# Patient Record
Sex: Male | Born: 1955 | Race: White | Hispanic: No | Marital: Married | Smoking: Never smoker
Health system: Southern US, Community
[De-identification: ages and names within clinical notes are randomized; demographics above are authoritative.]

## PROBLEM LIST (undated history)

## (undated) DIAGNOSIS — K5790 Diverticulosis of intestine, part unspecified, without perforation or abscess without bleeding: Secondary | ICD-10-CM

## (undated) DIAGNOSIS — I252 Old myocardial infarction: Secondary | ICD-10-CM

## (undated) DIAGNOSIS — E119 Type 2 diabetes mellitus without complications: Secondary | ICD-10-CM

## (undated) DIAGNOSIS — N200 Calculus of kidney: Secondary | ICD-10-CM

## (undated) DIAGNOSIS — Z860100 Personal history of colon polyps, unspecified: Secondary | ICD-10-CM

## (undated) DIAGNOSIS — I2510 Atherosclerotic heart disease of native coronary artery without angina pectoris: Secondary | ICD-10-CM

## (undated) DIAGNOSIS — I10 Essential (primary) hypertension: Secondary | ICD-10-CM

## (undated) DIAGNOSIS — E785 Hyperlipidemia, unspecified: Secondary | ICD-10-CM

## (undated) HISTORY — DX: Type 2 diabetes mellitus without complications: E11.9

## (undated) HISTORY — PX: CARDIAC CATHETERIZATION: SHX172

## (undated) HISTORY — DX: Essential (primary) hypertension: I10

## (undated) HISTORY — DX: Atherosclerotic heart disease of native coronary artery without angina pectoris: I25.10

## (undated) HISTORY — PX: OTHER SURGICAL HISTORY: SHX169

## (undated) HISTORY — DX: Personal history of colonic polyps: Z86.010

## (undated) HISTORY — DX: Hyperlipidemia, unspecified: E78.5

## (undated) HISTORY — DX: Old myocardial infarction: I25.2

## (undated) HISTORY — PX: CORONARY ANGIOPLASTY: SHX604

## (undated) HISTORY — DX: Personal history of colon polyps, unspecified: Z86.0100

## (undated) HISTORY — DX: Calculus of kidney: N20.0

## (undated) HISTORY — DX: Diverticulosis of intestine, part unspecified, without perforation or abscess without bleeding: K57.90

---

## 1999-08-24 ENCOUNTER — Encounter: Admission: RE | Admit: 1999-08-24 | Discharge: 1999-11-22 | Payer: Self-pay | Admitting: Internal Medicine

## 2001-04-02 ENCOUNTER — Encounter: Payer: Self-pay | Admitting: Internal Medicine

## 2001-04-02 ENCOUNTER — Observation Stay (HOSPITAL_COMMUNITY): Admission: EM | Admit: 2001-04-02 | Discharge: 2001-04-03 | Payer: Self-pay | Admitting: *Deleted

## 2001-04-03 ENCOUNTER — Inpatient Hospital Stay (HOSPITAL_COMMUNITY): Admission: AD | Admit: 2001-04-03 | Discharge: 2001-04-05 | Payer: Self-pay | Admitting: Internal Medicine

## 2001-04-19 ENCOUNTER — Encounter (HOSPITAL_COMMUNITY): Admission: RE | Admit: 2001-04-19 | Discharge: 2001-05-19 | Payer: Self-pay | Admitting: *Deleted

## 2001-05-23 ENCOUNTER — Encounter (HOSPITAL_COMMUNITY): Admission: RE | Admit: 2001-05-23 | Discharge: 2001-06-22 | Payer: Self-pay | Admitting: *Deleted

## 2001-06-25 ENCOUNTER — Encounter (HOSPITAL_COMMUNITY): Admission: RE | Admit: 2001-06-25 | Discharge: 2001-07-25 | Payer: Self-pay | Admitting: *Deleted

## 2004-09-03 ENCOUNTER — Ambulatory Visit: Payer: Self-pay | Admitting: *Deleted

## 2005-05-27 ENCOUNTER — Ambulatory Visit: Payer: Self-pay | Admitting: *Deleted

## 2006-05-02 ENCOUNTER — Ambulatory Visit: Payer: Self-pay | Admitting: *Deleted

## 2007-08-29 ENCOUNTER — Ambulatory Visit: Payer: Self-pay | Admitting: Cardiovascular Disease

## 2007-10-02 ENCOUNTER — Encounter (HOSPITAL_COMMUNITY): Admission: RE | Admit: 2007-10-02 | Discharge: 2007-11-01 | Payer: Self-pay | Admitting: Cardiovascular Disease

## 2007-10-02 ENCOUNTER — Ambulatory Visit: Payer: Self-pay | Admitting: Cardiology

## 2009-02-11 DIAGNOSIS — E785 Hyperlipidemia, unspecified: Secondary | ICD-10-CM | POA: Insufficient documentation

## 2009-02-11 DIAGNOSIS — I10 Essential (primary) hypertension: Secondary | ICD-10-CM | POA: Insufficient documentation

## 2009-02-11 DIAGNOSIS — I2510 Atherosclerotic heart disease of native coronary artery without angina pectoris: Secondary | ICD-10-CM | POA: Insufficient documentation

## 2009-02-11 DIAGNOSIS — E119 Type 2 diabetes mellitus without complications: Secondary | ICD-10-CM

## 2009-02-13 ENCOUNTER — Ambulatory Visit: Payer: Self-pay | Admitting: Cardiovascular Disease

## 2010-07-28 ENCOUNTER — Ambulatory Visit (HOSPITAL_COMMUNITY): Admission: RE | Admit: 2010-07-28 | Discharge: 2010-07-28 | Payer: Self-pay | Admitting: Internal Medicine

## 2011-02-01 NOTE — Assessment & Plan Note (Signed)
[facility]ALTHCARE                       [place] CARDIOLOGY OFFICE NOTE   PHELAN, SCHADT                      MRN:          [id number]  DATE:02/13/2009                            DOB:          [birth year]    Ms. Dockendorf seen today in followup for his coronary disease.  He has had  a previous angioplasty of an OM branch back in 2002 by Dr. Gerri Spore.  His last stress Myoview study was done in January 2009.  He had normal  myocardial perfusion.  Of note, he tends to have abnormal EKG responses  to exercise.  These would appear to be false positives.  Overall, he has  been compliant with his meds.  He has hypertension, hyperlipidemia, and  type 2 diabetes.  He sees Dr. Ouida Sills for his primary care needs.  Unfortunately, his weight continues to be high and we had a long  discussion regarding this and talked particularly about a low carb [place]-type diet.  He is currently 228 pounds.  He has had some erectile  dysfunction which I also would attribute to his diabetes with autonomic  dysfunction and small vessel disease.  He is not a good candidate for by  her spouse given his coronary artery disease.   I think the patient can easily lose 20-30 pounds if he cuts up the carbs  particularly pasta, bread, and starch, and potatoes.   His hemoglobin A1c is elevated at 7.0 which also indicates poor diet.  I  reviewed lab work from Dr. Alonza Smoker office.  His hematocrit was good at  46.5, creatinine is 0.91.  His blood sugar was elevated at 171.  LFTs  were normal.  His LDL was 75 and his hemoglobin A1c was elevated at 7.0.   Otherwise, he is doing well.  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   His hours are somewhat long.  He gets up early in the morning and can  get home late.  He tries to work out at [employer] twice a week, but  probably he does not do enough aerobic activity.  He has not had chest  pain, PND, or orthopnea.  There has  been no lower extremity edema.  There has been no diaphoresis.  The 10-point review of systems otherwise  negative outside of erectile dysfunction.   He is happily married.  He has no kids.  His wife's health seems to be  good.  He does not drink or smoke.   MEDICATIONS:  1. He is on Toprol 25 a day.  2. Altace 2-1/2 a day.  3. Multivitamins.  4. Niacin 100 g nightly.  5. Junuvia 100 mics daily.  6. Simvastatin 40 a day.  7. Metformin 2 g daily.  8. Glimepiride 4 mg a day.   PHYSICAL EXAMINATION:  GENERAL:  Exam is remarkable for a jovial  overweight male in no distress.  Affect is appropriate.  VITAL SIGNS:  Weight is 228, blood pressure 110/70, pulse 85 and  regular, respiratory rate 14, afebrile.  HEENT:  Unremarkable.  Carotids are normal without bruit.  No  lymphadenopathy, thyromegaly, or JVP elevation.  LUNGS:  Clear.  Good diaphragmatic motion.  No wheezing.  S1 and S2  normal heart sounds.  PMI normal.  ABDOMEN:  Benign.  Bowel sounds positive.  No AAA.  No tenderness.  No  bruit.  No hepatosplenomegaly or hepatojugular reflux.  No tenderness.  EXTREMITIES:  Distal pulses are intact with no edema.  NEUROLOGIC:  Nonfocal.  SKIN:  Warm and dry.  MUSCULOSKELETAL:  No muscular weakness.   EKG shows sinus rhythm with T-wave changes in the inferior leads, which  are slightly more prominent than his last EKG.   IMPRESSION:  1. Coronary disease, previous obtuse marginal intervention      asymptomatic, low-risk Myoview year ago.  Continue aspirin and beta-      blocker.  2. Hypercholesterolemia with end range, currently not having side      effects, continue current medications.  3. Diabetes, poor control.  Adopt a [employer].  Follow up      Dr. Ouida Sills for hemoglobin A1c in 3 months, target weight loss about      20 pounds in the next 3 months.  4. Hypertension currently well-controlled.  Continue current dose of      Altace.  No evidence of gross  proteinuria.  5. Erectile dysfunction again should improve with weight loss and      better diabetes control, not a good candidate for Cialis or Viagra.     Noralyn Pick. Eden Emms, MD, [facility]  Electronically Signed    PCN/MedQ  DD: 02/13/2009  DT: 02/14/2009  Job #: [id number]   cc:   Kingsley Callander. Ouida Sills, MD

## 2011-02-01 NOTE — Assessment & Plan Note (Signed)
[facility] HEALTHCARE                       [place] CARDIOLOGY OFFICE NOTE   Jonathan Walsh, Jonathan Walsh                      MRN:          [id number]  DATE:08/29/2007                            DOB:          November 11, 1955    Jonathan Walsh is seen as a new patient to me.  He is a previous patient of  Dr. Dorethea Clan.  He has known coronary disease with angioplasty to the OM in  '02.  He has not had a follow up stress-test since '03.  He unfortunately has  diabetes which is poorly controlled.  His hemoglobin A1C's have been in  the high 7's.  He is supposed to check his sugar at home by maybe does  it once a week.  He has gained about 15 pounds in the last 6 months.  Had a long discussion with Fayrene Fearing regarding this. He understands the  increased risk of insulin resistance as his weight goes up.  He also  understands that he needs a stress-test given his diabetes and previous  coronary disease with no functional study in the last 5 years.   Fortunately he does not smoke.   Risk factors are otherwise fairly well modified outside of his diabetes.  Prior to his angioplasty he did get some pressure and diaphoresis, he  has not had any of these.  There has been no syncope, palpitations, PND,  orthopnea.  He has trace edema at the end of the day.  His review of  systems is otherwise negative.   CURRENT MEDICATIONS:  Toprol 25 daily, Altace 2.5 daily, multivitamins,  niacin 1 gram q.h.s., an aspirin daily, Januvia 1 gram daily,  Simvastatin 40 daily, Metformin 2 grams daily.   PHYSICAL EXAMINATION:  GENERAL:  Exam is remarkable for an overweight  middle-aged white male in no distress. Affect is appropriate.  VITAL SIGNS:  Blood pressure is 112/70, pulse 70 and regular,  respiratory rate 12.  NECK:  Supple. Carotids are normal with no bruit, no lymphadenopathy,  thyromegaly or JVP elevation.  LUNGS:  Clear with good diaphragmatic motion. No wheezing.  CARDIOVASCULAR:  Heart  sounds are distant, there is an S1 and S2, no  obvious murmur, PMI is not palpable.  ABDOMEN:  Benign, bowel sounds positive, no triple A, no tenderness, no  hepatosplenomegaly or hepatojugular reflux.  EXTREMITIES:  Distal pulses are intact. Trace edema.  NEURO:  Nonfocal, no muscular weakness.   IMPRESSION:  1. Previous coronary disease with angioplasty to the OM in 2002.      Followup Myoview.  2. Diabetes poorly controlled. Followup with Dr. Leonel Ramsay. Hemoglobin      A1C in 3 months. Increase home monitoring.  3. Hypertension currently well controlled. Continue ACE inhibitor      particularly in light of its renal protective effects for his      diabetes.  4. Hyperlipidemia.  Followup liver profile in 6 months. Continue      Simvastatin 40 a day.   I will see the patient back in a year as long as his stress-test is  normal.     Theron Arista C. Eden Emms, MD, [facility]  Electronically Signed    PCN/MedQ  DD: 08/29/2007  DT: 08/29/2007  Job #: [id number]   cc:   Kingsley Callander. Ouida Sills, MD

## 2011-02-04 NOTE — Discharge Summary (Signed)
[place]  Patient:    Jonathan Walsh, Jonathan Walsh                        MRN: [id number] Adm. Date:  04/03/01 Disc. Date: 04/05/01 Dictator:   Tereso Newcomer, P.A.                           Discharge Summary  DATE OF BIRTH:  [birth year]  ADDENDUM:  The patient will remain on Plavix 75 mg a day for six to nine months, and consideration should be made for Foltx in the future. DD:  04/05/01 TD:  04/05/01 Job: [id number]

## 2011-02-04 NOTE — Assessment & Plan Note (Signed)
[facility] HEALTHCARE                         [place] CARDIOLOGY OFFICE NOTE   Jonathan Walsh, Jonathan Walsh                      MRN:          [id number]  DATE:05/02/2006                            DOB:          1955/10/30    Primary care physician is Dr. Ouida Sills.   [name] returns today for a routine followup.  He is a man with coronary  disease status post percutaneous revascularization of his obtuse marginal  back in 2002.  He has diabetes, hypertension and hyperlipidemia.  He has  done very well over the course of the last 9 months since I have seen him.  No real problems.  He is here just for secondary prophylaxis for his  coronary disease.   PHYSICAL EXAMINATION:  He is 225 pounds.  Blood pressure is 118/72.  His  pulse is 70.  His chest is clear.  He has no jugular venous distention or  carotid bruits.  Lower extremities are without significant edema.   Most recent labs from January show a basic metabolic panel which looks good  with the exception of slightly elevated glucose at 113.  His total  cholesterol is 158.  Triglycerides are 173.  HDL 53.  LDL 69.  His ALT looks  normal, and his hemoglobin A1c is 6.2.   MEDICATIONS:  Toprol XL 25 once a day, Altace 2.5 once a day, multivitamin  once a day, Folgard 25 mg once a day, Crestor 10 mg once a day, Niacin 1000  mg q.h.s., metformin 1500 units a day, aspirin 325 a day, and he takes  Januvia 100 mg once a day.   Overall, he is doing fine.  I do not think there are any particular problems  with his secondary prophylaxis.  Dr. Ouida Sills appears to be doing a reasonably  good job with that, so hopefully we will see him back in 12 months for  routine followup.                                   Farris Has. Dorethea Clan, MD   JMH/MedQ  DD:  05/02/2006  DT:  05/03/2006  Job #:  [id number]   cc:   Kingsley Callander. Ouida Sills, MD

## 2011-02-04 NOTE — Cardiovascular Report (Signed)
. [facility]  Patient:    Jonathan Walsh, Jonathan Walsh                        MRN: [id number] Proc. Date: 04/05/01 Adm. Date:  [id number] Attending:  Pricilla Riffle CC:         Carylon Perches, M.D.  Dietrich Pates, M.D. [facility]   Cardiac Catheterization  PROCEDURES PERFORMED: 1. Left heart catheterization with coronary angiography and left    ventriculography. 2. Percutaneous transluminal coronary angioplasty utilizing the Cutting    Balloon of the second obtuse marginal branch.  INDICATIONS:  The patient is a 55 year old male, who presented to [facility] with chest pain.  ECG showed lateral ST segment depression which resolved with therapy.  He had elevated troponins consistent with a small non-Q-wave myocardial infarction.  He was referred for cardiac catheterization.  DESCRIPTION OF PROCEDURE:  A 6 French sheath was placed in the right femoral artery.  Standard Judkins 6 French catheters were utilized.  Contrast was Omnipaque.  There were no complications.  RESULTS:  HEMODYNAMICS:  Left ventricular pressure 122/20, aortic pressure 122/82. There was no aortic valve gradient.  LEFT VENTRICULOGRAM:  The wall motion is normal.  Ejection fraction is estimated at greater than 60%.  There is no mitral regurgitation.  CORONARY ARTERIOGRAPHY:  (Right dominant).  Coronary arteries are diffusely ectatic.  Left main:  Left main has luminal irregularities.  Left anterior descending:  Left anterior descending artery has mild ectasia with 20% stenosis in the mid vessel followed by 25% stenosis in the mid vessel.  The distal vessel has 30% followed by 20% stenosis.  There is a normal sized first diagonal with a 40% stenosis, a small second diagonal with a 70% stenosis at its origin.  Left circumflex:  The left circumflex also has moderate ectasia.  There is a 30% stenosis in the mid vessel.  The circumflex gives rise to a small OM-1, a large branching OM-2 and a  small OM-3.  OM-2 has a 99% stenosis in its medial branch right at the bifurcation.  There is a 30% stenosis extending into the more lateral branch at OM-2.  Right coronary artery:  The right coronary artery has diffuse ectasia and diffuse irregularities throughout.  The proximal mid vessel or diffuse 25% stenoses.  In the distal vessel is a 30% following by a second 30% stenosis. There is a large posterior descending artery with a 30% stenosis in the mid vessel.  There is a small posterolateral branch.  IMPRESSIONS: 1. Normal left ventricular systolic function. 2. Coronary arteries notable for diffuse ectasia with one-vessel coronary    artery disease characterized by 99% stenosis in the branch of the second    obtuse marginal.  PLAN:  Percutaneous intervention of the obtuse marginal.  See below.  PERCUTANEOUS TRANSLUMINAL CORONARY ANGIOPLASTY PROCEDURE:  Following completion of diagnostic catheterization, we proceeded with coronary intervention.  The 6 French sheath in the right femoral artery was exchanged over a wire for a 7 [place] sheath.  We used a 7 [place] Voda left 3.5 guiding catheter.  Heparin and Integrilin were administered per protocol.  We advanced a traverse wire under fluoroscopic guidance into the medial branch of OM-2. We then advanced a second traverse wire under fluoroscopic guidance into the lateral branch of OM-2.  We then performed PTCA of the lesion in the medial branch of OM-2 utilizing a 2.0 x 15 mm Maverick balloon.  This balloon was inflated  to 8 atmospheres.  We then utilized a Cutting Balloon which was 2.25 x 10 mm.  This balloon was inflated sequentially to 6, 8 and 10 atmospheres respectively.  We then went back with our 2.0 x 15 mm Maverick balloon across this same lesion, inflated this to 14 atmospheres for 2 minutes and then a second inflation to 14 atmospheres for 3 minutes.  This resulted in significant improvement in the vessel lumen.  However,  the more lateral branch at its origin became somewhat hazy.  We therefore positioned the 2.0 x 15 mm Maverick balloon across the lateral branch and inflated this to 12 atmospheres.  We finally went back again in the medial branch with the 2.0 x 15 mm Maverick balloon, inflated it again to 14 atmospheres for 2 minutes.  Final angiographic images were obtained revealing patency of both branches of OM-2.  The 99% stenosis in the medial branch has been reduced to 20% residual with TIMI-3 flow.  Patency of the lateral branch was maintained with a stable 30% stenosis.  COMPLICATIONS:  None.  RESULTS:  Successful PTCA utilizing the Cutting Balloon of the medial branch of OM-2.  A 99% stenosis was reduced to 20% residual with TIMI-3 flow.  PLAN:  Integrilin will be continued for 18 hours.  It was recommended that the patient be treated with Plavix for 6-9 months due to his presentation with an acute coronary syndrome, his multiple cardiovascular risk factors and diffuse changes in his coronary arteries as described. DD:  04/04/01 TD:  04/05/01 Job: [id number]

## 2011-02-04 NOTE — Discharge Summary (Signed)
. [facility]  Patient:    Jonathan Walsh, Jonathan Walsh                        MRN: [id number] Adm. Date:  [id number] Disc. Date: 04/05/01 Attending:  Pricilla Riffle Dictator:   Tereso Newcomer, P.A. CC:         Dr. Carylon Perches   Discharge Summary  DISCHARGE DIAGNOSES: 1. Non-Q wave myocardial infarction. 2. Coronary artery disease. 3. Normal left ventricular ejection fraction. 4. Diabetes mellitus type 2. 5. Hyperlipidemia. 6. Family history of early coronary artery disease.  PROCEDURES: 1. Cardiac catheterization by Dr. Daisey Must on April 04, 2001, revealing    left main normal, left anterior descending mid 20/25, distal 30/20,    diagonal-1 with normal with 40% stenosis, diagonal-2 small with 70%    stenosis, left circumflex mid 30%, obtuse marginal large 99%, medial branch    30% at branch, right coronary artery diffuse 25% distal/30, posterior    descending artery large 30%, normal left ventricular ejection fraction    greater than 60%, normal mitral regurgitation, normal wall motion. 2. Status post percutaneous transluminal coronary angioplasty with cutting    balloon to obtuse marginal reducing stenosis from 99% to 20%.  HOSPITAL COURSE:  This 55 year old male was seen originally at [facility] in [place].  On the day prior to admission, just after pushing a stalled golf cart up a slight incline, he had a twinge of chest pain and after eating he had chest heaviness and diaphoresis that last for about five minutes.  This went away and then came back for about 40 minutes.  His EKG revealed new lateral ST depression and troponins of 0.08 and 0.048.  He was transferred to [facility] for cardiac catheterization.  He was continued on aspirin, Lopressor and Lovenox.  He denied any further chest pain.  His hemodynamics remained stable and on April 04, 2001, he underwent cardiac catheterization by Dr. Gerri Spore.  The results are noted above.   He tolerated the procedure and had no immediate complications.  On the morning of April 05, 2001, he was found to be in stable condition without any chest pain. His right groin was without hematomas or bruits.  He would be ready for discharge once his Integrilin infusion was finished.  LABORATORY DATA AND X-RAY FINDINGS:  White blood cell count 11,400, hemoglobin 15.8, hematocrit 44.4, platelet count 286,000.  INR 1.1.  Sodium 134, potassium 4.1, chloride 104, CO2 26, BUN 13, creatinine 0.9, glucose 100, total bilirubin 0.7, Alk phos 53, SGOT 35, SGPT 51, total protein 7.3, albumin 4.2, calcium 8.7.  Cardiac enzymes with total CK 147, CK-MB 2.1, troponin I 0.06.  Second CK 152, CK-MB 4.7, troponin I 0.48.  Third CK 152, CK-MB 2.3, troponin I 0.36.  Fourth CK 92, CK-MB 0.8.  DISCHARGE MEDICATIONS: 1. Lopressor 25 mg b.i.d. 2. Pravachol 40 mg q.d. 3. Avandia 4 mg q.d. 4. Plavix 75 mg q.d. 5. Coated aspirin 325 mg q.d. 6. Altace 2.5 mg q.d. x 1 week, then 5 mg q.d. x 3 weeks and then 10 mg a day. 7. Nitroglycerin 0.4 mg sublingual p.r.n. chest pain. 8. Glucophage XR to be restarted on Saturday, July 20.  ACTIVITY:  No driving for one week.  No heavy lifting, exertion or work until seen by doctor or P.A. again.  DIET:  Low fat, low sodium, diabetic diet.  SPECIAL INSTRUCTIONS:  The patient should call our office  for any groin swelling, bleeding or bruising.  The patients Altace will be titrated upward. He will need a BMP checked on followup on July 22.  His Altace is 2.5 mg a day x 1 week, then 5 mg a day x 3 weeks, then 10 mg a day if his blood pressure tolerates and his renal function stays normal.  The patient works a low stress job and wishes to return to work in one week.  This will be reassessed at follow-up appointment.  He has been advised of the dangers restarting his Glucophage too soon after cardiac catheterization.  FOLLOWUP:  He will follow up with P.A. for Dr. Daisey Must on Monday, July 22, at 11 a.m.  Follow up with Dr. Ouida Sills as needed. DD:  04/05/01 TD:  04/05/01 Job: [id number]

## 2011-03-08 ENCOUNTER — Other Ambulatory Visit (HOSPITAL_COMMUNITY): Payer: Self-pay | Admitting: Internal Medicine

## 2011-03-08 DIAGNOSIS — L539 Erythematous condition, unspecified: Secondary | ICD-10-CM

## 2011-03-09 ENCOUNTER — Ambulatory Visit (HOSPITAL_COMMUNITY)
Admission: RE | Admit: 2011-03-09 | Discharge: 2011-03-09 | Disposition: A | Discharge status: Home / Self Care | Payer: 59 | Source: Ambulatory Visit | Attending: Internal Medicine | Admitting: Internal Medicine

## 2011-03-09 DIAGNOSIS — M79609 Pain in unspecified limb: Secondary | ICD-10-CM | POA: Insufficient documentation

## 2011-03-09 DIAGNOSIS — M7989 Other specified soft tissue disorders: Secondary | ICD-10-CM | POA: Insufficient documentation

## 2011-03-09 DIAGNOSIS — L539 Erythematous condition, unspecified: Secondary | ICD-10-CM

## 2011-09-14 ENCOUNTER — Encounter: Payer: Self-pay | Admitting: Cardiology

## 2012-07-12 ENCOUNTER — Telehealth (HOSPITAL_COMMUNITY): Payer: Self-pay | Admitting: Dietician

## 2012-07-12 NOTE — Telephone Encounter (Signed)
Appointment scheduled for 07/13/12 at 1:00 PM.

## 2012-07-13 ENCOUNTER — Encounter (HOSPITAL_COMMUNITY): Payer: Self-pay | Admitting: Dietician

## 2012-07-13 NOTE — Progress Notes (Addendum)
Outpatient Initial Nutrition Assessment  Date:07/13/2012   Appt Start Time: 1058  Referring Physician: Dr. Ouida Sills Reason for Visit: diabetes  Nutrition Assessment:  Height: 5' 6.5" (168.9 cm)   Weight: 224 lb (101.606 kg)   IBW: 145# %IBW: 154% UBW: 210# %UBW: 107%  Body mass index is 35.61 kg/(m^2). Classified as obesity, class II.   Goal Weight: 202# (10% weight loss) Weight hx: Pt reports his weight is "higher than normal". His lowest weight was 190-202 as a younger adult. He weighed 210# about 1 year ago. He considers 210# his UBW.   Estimated nutritional needs: 2174-2370 kcals daily, 80-102 grams protein daily, 2.2-2.4 L fluid daily  PMH:  Past Medical History  Diagnosis Date  . Diabetes mellitus   . Hyperlipidemia   . Hypertension   . Coronary artery disease     Medications:  Current Outpatient Rx  Name Route Sig Dispense Refill  . ASPIRIN 325 MG PO TABS Oral Take 325 mg by mouth daily.      Marland Kitchen METFORMIN HCL 500 MG PO TABS Oral Take 500 mg by mouth.      . METOPROLOL SUCCINATE ER 25 MG PO TB24 Oral Take 25 mg by mouth.      Marland Kitchen NIACIN ER (ANTIHYPERLIPIDEMIC) 1000 MG PO TBCR Oral Take 1,000 mg by mouth.      Marland Kitchen NITROGLYCERIN 0.4 MG/HR TD PT24 Transdermal Place 1 patch onto the skin.      Marland Kitchen RAMIPRIL 2.5 MG PO TABS Oral Take 2.5 mg by mouth daily.      Marland Kitchen SIMVASTATIN 40 MG PO TABS Oral Take 40 mg by mouth.      Marland Kitchen SITAGLIPTIN PHOSPHATE 100 MG PO TABS Oral Take 100 mg by mouth.        Labs: CMP  No results found for this basename: na, k, cl, co2, glucose, bun, creatinine, calcium, prot, albumin, ast, alt, alkphos, bilitot, gfrnonaa, gfraa    Lipid Panel  No results found for this basename: chol, trig, hdl, cholhdl, vldl, ldlcalc     No results found for this basename: HGBA1C   No results found for this basename: GLUF, MICROALBUR, LDLCALC, CREATININE     Per Dr. Alonza Smoker records: Hgb A1c: 7.8. Pt reports previous Hgb A1c was 6.4.   Lifestyle/ social habits: Mr. Huckaba is  a very pleasant gentleman who lives in [place], [place]. He lives with his wife, who is present with them today. They have no children. He works full time as a Production designer, theatre/television/film at a [employer] and his job requires a good amount of travelling. He reports his stress level as 5/10, citing time management and travelling for work as his main stressor. He currently walk 3 days a week, about 2 miles at a time. He also has access to a gym. He reports that he was much more active in the past, but has difficult fitting exercise into his work schedule.   Nutrition hx/habits: Mr. Wiederholt reports that he has gone on diets in the past to successfully lose weight and manage glycemic control. He reports he has intermittently followed the [employer] per the suggestion of his cardiologist (Dr. Eden Emms), however, it would never "stick" for long periods of time. He is looking for a more "balanced approach" to his lifestyle. He reports that he eats well when he is at home, as he wife prepares most of the meal and controls his portions. He reports he struggles when he is travelling or going out to eat. He  also admits to excessive snacking. He finds it difficult to follow a consistent meal pattern at times. He reports he attended a diabetic class several years ago, however, there is no record of attendance of pt being seen in this department at [facility].   Diet recall: Breakfast (6 AM): 1/2 sandwich thin with Smuckers Natural Peanut Butter, diet Sun Drop; Mid-morning snack: 6 pack peanut butter crackers; Lunch: ham sandwich made with sandwich thin, low sodium ham, and low fat cheese, SF jello, natural applesauce; Snack: roasted almonds or snack size bag of chips; Dinner: salad OR meat and vegetables OR vegetable plate  Nutrition Diagnosis: Inconsistent carbohydrate intake r/t nutrition-related knowledge deficit, disordered eating pattern AEB diet recall, Hgb A1c: 7.8.  Nutrition Intervention: Nutrition rx: 1800 kcal NAS, diabetic  diet; 3 meals per day (60-75 grams of carbohydrate per meal); 1-2 snacks per day (15-30 grams carbohydrate per snack); low calorie beverages only; 2.5 hours physical activity per week  Education/Counseling Provided: Educated pt on principles of diabetic diet. Discussed carbohydrate metabolism in relation to diabetes. Educated pt on plate method, portion sizes, and sources of carbohydrate. Discussed importance of regular meal pattern. Discussed importance of adding sources of whole grains to diet to improve glycemic control. Also encouraged to choose low fat dairy, lean meats, and whole fruits and vegetables more often. Discussed nutritional content of foods commonly eaten and discussed healthier alternatives. Discussed importance of compliance to prevent further complications of disease. Educated pt on importance of physical activity (goal of at least 30 minutes 5 times per week) along with a healthy diet to achieve weight loss and glycemic goals. Encouraged slow, moderate weight loss of 1-2# per week, or 7-10% of current body weight. Provided AND handout (Type 2 Diabetes Nutrition Therapy). Also provided plate method and "Carbohydrate Counting and Meal Planning" handouts. Assessed understanding via Teach Back method; pt was successfully able to demonstrate the plate method and carbohydrate counting using food models and create a sample meal agreeable to guidelines discussed in this session.   Understanding, Motivation, Ability to Follow Recommendations: Expect good compliance.   Monitoring and Evaluation: Goals: 1) 1-2# weight loss per week; 2) 2.5 hours physical activity daily; 3) Hgb A1c < 7.0   Recommendations: 1) For weight loss: 1774-1870 kcals daily; 2) Break up exercise into smaller, more frequent sessions or schedule exercise on days when less busy; 3) Identify healthy options at restaurants where you normally frequent and order those during lunch meetings; 4) Limit 1 starch per meal; 5) Try to eat  around the same time each day; 6) Consider choosing low calorie snacks or cutting down portions of snacks (ex. Eating 3 crackers in pack instead of 6 at a time)  F/U: PRN. Provided RD contact information.   Melody Haver, RD, LDN 07/13/2012  Appt EndTime: 1200

## 2015-10-28 ENCOUNTER — Encounter: Payer: Self-pay | Admitting: Cardiology

## 2016-04-29 ENCOUNTER — Ambulatory Visit: Payer: Self-pay | Admitting: Cardiovascular Disease

## 2016-05-04 ENCOUNTER — Encounter: Payer: Self-pay | Admitting: *Deleted

## 2016-05-04 NOTE — Progress Notes (Signed)
Cardiology Office Note  Date: 05/05/2016   ID: Jonathan, Walsh 10-18-1955, MRN [id number]  PCP: Asencion Noble, MD  Consulting Cardiologist: Rozann Lesches, MD   Chief Complaint  Patient presents with  . Coronary Artery Disease    History of Present Illness: Jonathan Walsh is a 60 y.o. male referred for cardiology consultation by Dr. Willey Blade. He is a former patient of Dr. Johnsie Cancel last seen in 2010. I reviewed his records and updated the chart. He presents to reestablish regular cardiology follow-up. He has done very well over the last several years without any significant angina symptoms or nitroglycerin use. At this time he is walking 1-2 miles most days of the week for exercise. He reports NYHA class II dyspnea. Sometimes feels fatigued. Last ischemic testing was in 2009 as outlined below.  I reviewed his ECG today which shows normal sinus rhythm with nonspecific ST changes. I reviewed his lab work from earlier this year, LDL was 83 at that time on Crestor. He follows regularly with Dr. Willey Blade and has been working on trying to get his blood sugar better controlled. He has lost nearly 40 pounds since his diagnosis of heart disease years ago.  I went over his cardiac catheterization and PCI as outlined below.  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  Past Medical History:  Diagnosis Date  . Coronary artery disease    Angioplasty of OM 2002  . Diverticulosis   . Essential hypertension   . History of colonic polyps    Colonoscopy 2011  . History of non-ST elevation myocardial infarction (NSTEMI)    2002  . Hyperlipidemia   . Nephrolithiasis   . Type 2 diabetes mellitus ([place])     History reviewed. No pertinent surgical history.  Current Outpatient Prescriptions  Medication Sig Dispense Refill  . aspirin 325 MG tablet Take 325 mg by mouth daily.      . canagliflozin (INVOKANA) 300 MG TABS tablet Take 300 mg by mouth daily before breakfast.      . glimepiride (AMARYL) 4 MG tablet Take 4 mg by mouth daily with breakfast.    . Liraglutide (VICTOZA [place]) Inject 18 Units into the skin.    . metFORMIN (GLUCOPHAGE) 500 MG tablet Take 500 mg by mouth. 4 tabs every morning    . metoprolol succinate (TOPROL-XL) 25 MG 24 hr tablet Take 25 mg by mouth.      . nitroGLYCERIN (NITROSTAT) 0.4 MG SL tablet Place 0.4 mg under the tongue every 5 (five) minutes as needed for chest pain.    . ramipril (ALTACE) 2.5 MG tablet Take 2.5 mg by mouth daily.      . rosuvastatin (CRESTOR) 20 MG tablet Take 20 mg by mouth daily.     No current facility-administered medications for this visit.    Allergies:  Review of patient's allergies indicates no known allergies.   Social History: The patient  reports that he has never smoked. He has never used smokeless tobacco. He reports that he does not drink alcohol or use drugs.   Family History: The patient's family history includes Arthritis in his mother; Asthma in his sister; CAD in his father and paternal grandfather; Diabetes Mellitus II in his father; Stroke in his father.   ROS:  Please see the history of present illness. Otherwise, complete review of systems is positive for none. No claudication. All other systems are reviewed and negative.   Physical Exam: VS:  BP  130/82 (BP Location: Right Arm, Cuff Size: Normal)   Pulse 93   Ht 5\' 8"  (1.727 m)   Wt 197 lb 12.8 oz (89.7 kg)   SpO2 96%   BMI 30.08 kg/m , BMI Body mass index is 30.08 kg/m.  Wt Readings from Last 3 Encounters:  05/05/16 197 lb 12.8 oz (89.7 kg)  04/07/16 199 lb (90.3 kg)  07/13/12 224 lb (101.6 kg)    General: Overweight male, appears comfortable at rest. HEENT: Conjunctiva and lids normal, oropharynx clear. Neck: Supple, no elevated JVP or carotid bruits, no thyromegaly. Lungs: Clear to auscultation, nonlabored breathing at rest. Cardiac: Regular rate and rhythm, no S3 or significant systolic murmur, no pericardial rub. Abdomen:  Soft, nontender, bowel sounds present, no guarding or rebound. Extremities: No pitting edema, distal pulses 2+. Skin: Warm and dry. Musculoskeletal: No kyphosis. Neuropsychiatric: Alert and oriented x3, affect grossly appropriate.  ECG: There is no old tracing available for comparison.  Recent Labwork:  February 2017: BUN 13, creatinine 0.8, potassium 4.5, AST 19, ALT 24, hemoglobin 17.2, platelets 245, cholesterol 160, triglycerides 160, HDL 45, LDL 83, hemoglobin A1c 7.7  Other Studies Reviewed Today:  Cardiac catheterization and PCI 04/04/2001: HEMODYNAMICS:  Left ventricular pressure 122/20, aortic pressure 122/82. There was no aortic valve gradient.  LEFT VENTRICULOGRAM:  The wall motion is normal.  Ejection fraction is estimated at greater than 60%.  There is no mitral regurgitation.  CORONARY ARTERIOGRAPHY:  (Right dominant).  Coronary arteries are diffusely ectatic.  Left main:  Left main has luminal irregularities.  Left anterior descending:  Left anterior descending artery has mild ectasia with 20% stenosis in the mid vessel followed by 25% stenosis in the mid vessel.  The distal vessel has 30% followed by 20% stenosis.  There is a normal sized first diagonal with a 40% stenosis, a small second diagonal with a 70% stenosis at its origin.  Left circumflex:  The left circumflex also has moderate ectasia.  There is a 30% stenosis in the mid vessel.  The circumflex gives rise to a small OM-1, a large branching OM-2 and a small OM-3.  OM-2 has a 99% stenosis in its medial branch right at the bifurcation.  There is a 30% stenosis extending into the more lateral branch at OM-2.  Right coronary artery:  The right coronary artery has diffuse ectasia and diffuse irregularities throughout.  The proximal mid vessel or diffuse 25% stenoses.  In the distal vessel is a 30% following by a second 30% stenosis. There is a large posterior descending artery with a 30% stenosis in  the mid vessel.  There is a small posterolateral branch.  IMPRESSIONS: 1. Normal left ventricular systolic function. 2. Coronary arteries notable for diffuse ectasia with one-vessel coronary    artery disease characterized by 99% stenosis in the branch of the second    obtuse marginal.  PLAN:  Percutaneous intervention of the obtuse marginal.  See below.  PERCUTANEOUS TRANSLUMINAL CORONARY ANGIOPLASTY PROCEDURE:  Following completion of diagnostic catheterization, we proceeded with coronary intervention.  The 6 French sheath in the right femoral artery was exchanged over a wire for a 7 [place] sheath.  We used a 7 [place] Voda left 3.5 guiding catheter.  Heparin and Integrilin were administered per protocol.  We advanced a traverse wire under fluoroscopic guidance into the medial branch of OM-2. We then advanced a second traverse wire under fluoroscopic guidance into the lateral branch of OM-2.  We then performed PTCA of the lesion in  the medial branch of OM-2 utilizing a 2.0 x 15 mm Maverick balloon.  This balloon was inflated to 8 atmospheres.  We then utilized a Cutting Balloon which was 2.25 x 10 mm.  This balloon was inflated sequentially to 6, 8 and 10 atmospheres respectively.  We then went back with our 2.0 x 15 mm Maverick balloon across this same lesion, inflated this to 14 atmospheres for 2 minutes and then a second inflation to 14 atmospheres for 3 minutes.  This resulted in significant improvement in the vessel lumen.  However, the more lateral branch at its origin became somewhat hazy.  We therefore positioned the 2.0 x 15 mm Maverick balloon across the lateral branch and inflated this to 12 atmospheres.  We finally went back again in the medial branch with the 2.0 x 15 mm Maverick balloon, inflated it again to 14 atmospheres for 2 minutes.  Final angiographic images were obtained revealing patency of both branches of OM-2.  The 99% stenosis in the medial branch has been  reduced to 20% residual with TIMI-3 flow.  Patency of the lateral branch was maintained with a stable 30% stenosis.  COMPLICATIONS:  None.  RESULTS:  Successful PTCA utilizing the Cutting Balloon of the medial branch of OM-2.  A 99% stenosis was reduced to 20% residual with TIMI-3 flow.  Exercise Myoview 10/14/2007: IMPRESSION:  Negative stress nuclear study revealing good exercise tolerance, no angina, EKG abnormalities that appear to represent a false positive in light of normal myocardial perfusion by scintigraphic imaging. Other findings as noted.  Assessment and Plan:  1. Symptomatically stable CAD status post angioplasty to the OM in 2002 in the setting of NSTEMI. He is on a stable cardiac regimen as outlined above without any regular nitroglycerin use. I reviewed his ECG, and we discussed follow-up objective ischemic testing. We will arrange an exercise Myoview (hold Toprol-XL) for reassessment of ischemic burden. Otherwise continue walking regimen and observation.  2. Hyperlipidemia, on Crestor, recent LDL 83.  3. Type 2 diabetes mellitus, followed by Dr. Willey Blade. He is trying to exercise more and lose weight, diabetes regimen outlined above. Hemoglobin A1c was 7.7% earlier this year.  4. Essential hypertension, no changes made present regimen.  Current medicines were reviewed with the patient today.   Orders Placed This Encounter  Procedures  . NM Myocar Multi W/Spect W/Wall Motion / EF  . EKG 12-Lead    Disposition: Follow-up with me in the [place] office in one year.  Signed, Satira Sark, MD, [facility] 05/05/2016 9:49 AM    [facility] [place], [place], [place] [address] Phone: [phone]; Fax: [phone]

## 2016-05-05 ENCOUNTER — Ambulatory Visit (INDEPENDENT_AMBULATORY_CARE_PROVIDER_SITE_OTHER): Payer: 59 | Admitting: Cardiology

## 2016-05-05 ENCOUNTER — Encounter: Payer: Self-pay | Admitting: Cardiology

## 2016-05-05 ENCOUNTER — Encounter: Payer: Self-pay | Admitting: *Deleted

## 2016-05-05 VITALS — BP 130/82 | HR 93 | Ht 68.0 in | Wt 197.8 lb

## 2016-05-05 DIAGNOSIS — I10 Essential (primary) hypertension: Secondary | ICD-10-CM | POA: Diagnosis not present

## 2016-05-05 DIAGNOSIS — E1165 Type 2 diabetes mellitus with hyperglycemia: Secondary | ICD-10-CM

## 2016-05-05 DIAGNOSIS — I2510 Atherosclerotic heart disease of native coronary artery without angina pectoris: Secondary | ICD-10-CM

## 2016-05-05 DIAGNOSIS — E785 Hyperlipidemia, unspecified: Secondary | ICD-10-CM

## 2016-05-05 DIAGNOSIS — E1159 Type 2 diabetes mellitus with other circulatory complications: Secondary | ICD-10-CM | POA: Diagnosis not present

## 2016-05-05 DIAGNOSIS — IMO0002 Reserved for concepts with insufficient information to code with codable children: Secondary | ICD-10-CM

## 2016-05-05 NOTE — Patient Instructions (Signed)
Medication Instructions:   Your physician recommends that you continue on your current medications as directed. Please refer to the Current Medication list given to you today.  Labwork: NONE  Testing/Procedures: Your physician has requested that you have en exercise stress myoview. For further information please visit [email or web address]. Please follow instruction sheet, as given.  Follow-Up:  Your physician recommends that you schedule a follow-up appointment in: 1 year at the [place] office. You will receive a reminder letter in the mail in about 10 months reminding you to call and schedule your appointment. If you don't receive this letter, please contact our office.  Any Other Special Instructions Will Be Listed Below (If Applicable).  If you need a refill on your cardiac medications before your next appointment, please call your pharmacy.

## 2016-05-12 ENCOUNTER — Encounter (HOSPITAL_COMMUNITY): Payer: Self-pay

## 2016-05-12 ENCOUNTER — Encounter (HOSPITAL_COMMUNITY)
Admission: RE | Admit: 2016-05-12 | Discharge: 2016-05-12 | Disposition: A | Discharge status: Home / Self Care | Payer: 59 | Source: Ambulatory Visit | Attending: Cardiology | Admitting: Cardiology

## 2016-05-12 ENCOUNTER — Inpatient Hospital Stay (HOSPITAL_COMMUNITY): Admission: RE | Admit: 2016-05-12 | Payer: 59 | Source: Ambulatory Visit

## 2016-05-12 DIAGNOSIS — I25119 Atherosclerotic heart disease of native coronary artery with unspecified angina pectoris: Secondary | ICD-10-CM | POA: Diagnosis not present

## 2016-05-12 DIAGNOSIS — I2510 Atherosclerotic heart disease of native coronary artery without angina pectoris: Secondary | ICD-10-CM | POA: Diagnosis not present

## 2016-05-12 LAB — NM MYOCAR MULTI W/SPECT W/WALL MOTION / EF
CHL CUP MPHR: 162 {beats}/min
CHL CUP NUCLEAR SRS: 2
CHL CUP NUCLEAR SSS: 4
CHL CUP RESTING HR STRESS: 73 {beats}/min
CHL RATE OF PERCEIVED EXERTION: 16
CSEPED: 9 min
CSEPEDS: 55 s
CSEPEW: 13 METS
LV dias vol: 62 mL (ref 62–150)
LV sys vol: 17 mL
Peak HR: 157 {beats}/min
Percent HR: 96 %
RATE: 0.34
SDS: 2
TID: 1.08

## 2016-05-12 MED ORDER — SODIUM CHLORIDE 0.9% FLUSH
INTRAVENOUS | Status: AC
  Administered 2016-05-12: 10 mL via INTRAVENOUS
  Filled 2016-05-12: qty 10

## 2016-05-12 MED ORDER — REGADENOSON 0.4 MG/5ML IV SOLN
INTRAVENOUS | Status: AC
  Filled 2016-05-12: qty 5

## 2016-05-12 MED ORDER — TECHNETIUM TC 99M TETROFOSMIN IV KIT
30.0000 | PACK | Freq: Once | INTRAVENOUS | Status: AC | PRN
  Administered 2016-05-12: 30 via INTRAVENOUS

## 2016-05-12 MED ORDER — TECHNETIUM TC 99M TETROFOSMIN IV KIT
10.0000 | PACK | Freq: Once | INTRAVENOUS | Status: AC | PRN
  Administered 2016-05-12: 9.7 via INTRAVENOUS

## 2016-06-29 ENCOUNTER — Ambulatory Visit (HOSPITAL_COMMUNITY): Payer: 59 | Attending: Orthopedic Surgery | Admitting: Occupational Therapy

## 2016-06-29 ENCOUNTER — Encounter (HOSPITAL_COMMUNITY): Payer: Self-pay | Admitting: Occupational Therapy

## 2016-06-29 DIAGNOSIS — M25511 Pain in right shoulder: Secondary | ICD-10-CM | POA: Diagnosis present

## 2016-06-29 DIAGNOSIS — R29898 Other symptoms and signs involving the musculoskeletal system: Secondary | ICD-10-CM

## 2016-06-29 DIAGNOSIS — M25611 Stiffness of right shoulder, not elsewhere classified: Secondary | ICD-10-CM

## 2016-06-29 NOTE — Patient Instructions (Signed)
  1) Flexion Wall Stretch    Face wall, place affected handon wall in front of you. Slide hand up the wall  and lean body in towards the wall.  Hold for 5-10 seconds, repeat 3-5X times each, 1-2x/day.     2) Towel Stretch with Internal Rotation   Gently pull up your affected arm  behind your back with the assist of a towel.  Hold for 5-10 seconds, repeat 3-5X times each, 1-2x/day.             3) Corner Stretch    Stand at a corner of a wall, place your arms on the walls with elbows bent. Lean into the corner until a stretch is felt along the front of your chest and/or shoulders.  Hold for 5-10 seconds, repeat 3-5X times each, 1-2x/day.    4) Posterior Capsule Stretch    Bring the involved arm across chest. Grasp elbow and pull toward chest until you feel a stretch in the back of the upper arm and shoulder.  Hold for 5-10 seconds, repeat 3-5X times each, 1-2x/day.    5) Scapular Retraction    Tuck chin back as you pinch shoulder blades together.   Hold for 5-10 seconds, repeat 3-5X times each, 1-2x/day.   6) External Rotation Stretch:     Place your affected hand on the wall with the elbow bent and gently turn your body the opposite direction until a stretch is felt. Hold for 5-10 seconds, repeat 3-5X times each, 1-2x/day.

## 2016-06-29 NOTE — Therapy (Signed)
[place], [place], [address] Phone: [phone]   Fax:  [phone]  Occupational Therapy Evaluation  Patient Details  Name: Jonathan Walsh MRN: [id number] Date of Birth: 1956/04/21 Referring Provider: Dr. Justice Britain  Encounter Date: 06/29/2016      OT End of Session - 06/29/16 1618    Visit Number 1   Number of Visits 8   Date for OT Re-Evaluation 07/29/16   Authorization Type UHC   Authorization Time Period No visit limit: after 30 visits contact insurance company    Authorization - Visit Number 1   Authorization - Number of Visits 30   OT Start Time [phone]   OT Stop Time 1020   OT Time Calculation (min) 36 min   Activity Tolerance Patient tolerated treatment well   Behavior During Therapy [facility] for tasks assessed/performed      Past Medical History:  Diagnosis Date  . Coronary artery disease    Angioplasty of OM 2002  . Diverticulosis   . Essential hypertension   . History of colonic polyps    Colonoscopy 2011  . History of non-ST elevation myocardial infarction (NSTEMI)    2002  . Hyperlipidemia   . Nephrolithiasis   . Type 2 diabetes mellitus (HCC)     No past surgical history on file.  There were no vitals filed for this visit.      Subjective Assessment - 06/29/16 1619    Subjective  S: I fell over a curb and landed on my arm.    Pertinent History Pt is a 60 y/o male presenting with right adhesive capsulitis and a complete rotator cuff tear s/p fall on 12/26/2015. Pt uses ice for pain management, reports pain has improved since the fall. Dr. Justice Britain referred pt to occupational therapy for evaluation and treatment.    Special Tests FOTO Score: 70/100 (30% impairment)   Patient Stated Goals To improve the range of motion and weakness in my arm.    Currently in Pain? No/denies           [facility] OT Assessment - 06/29/16 0943      Assessment   Diagnosis Right shoulder adhesive  capsulitis; complete right rotator cuff tear   Referring Provider Dr. Justice Britain   Onset Date 12/26/15   Prior Therapy None     Precautions   Precautions None     Restrictions   Weight Bearing Restrictions No     Balance Screen   Has the patient fallen in the past 6 months Yes   How many times? 1   Has the patient had a decrease in activity level because of a fear of falling?  No   Is the patient reluctant to leave their home because of a fear of falling?  No     Home  Environment   Family/patient expects to be discharged to: Private residence   Living Arrangements Spouse/significant other   Available Help at Discharge Family   Lives With Spouse     Prior Function   Level of [place] with basic ADLs   Vocation Retired     ADL   ADL comments Pt is having difficulty with lifting weighted objects, reaching overhead, reaching behind the back     Written Expression   Dominant Hand Right     ROM / Strength   AROM / PROM / Strength AROM;PROM;Strength     Palpation   Palpation comment Min/mod fascial restrictions in  right upper arm and anterior deltoid regions     AROM   Overall AROM Comments Assessed seated, ER/IR adducted   AROM Assessment Site Shoulder   Right/Left Shoulder Right   Right Shoulder Flexion 110 Degrees   Right Shoulder ABduction 125 Degrees   Right Shoulder Internal Rotation 90 Degrees   Right Shoulder External Rotation 65 Degrees     PROM   Overall PROM Comments Assessed supine, ER/IR adducted   PROM Assessment Site Shoulder   Right/Left Shoulder Right   Right Shoulder Flexion 155 Degrees   Right Shoulder ABduction 135 Degrees   Right Shoulder Internal Rotation 90 Degrees   Right Shoulder External Rotation 73 Degrees     Strength   Overall Strength Comments Assessed seated, ER/IR adducted   Strength Assessment Site Shoulder   Right/Left Shoulder Right   Right Shoulder Flexion 4/5   Right Shoulder Extension 4-/5   Right  Shoulder Internal Rotation 4/5   Right Shoulder External Rotation 4/5                         OT Education - 06/29/16 1618    Education provided Yes   Education Details shoulder stretchse   Person(s) Educated Patient   Methods Explanation;Demonstration;Handout   Comprehension Verbalized understanding;Returned demonstration          OT Short Term Goals - 06/29/16 1626      OT SHORT TERM GOAL #1   Title Pt will be provided with and educated on HEP.    Time 4   Period Weeks   Status New     OT SHORT TERM GOAL #2   Title Pt will decrease pain to 2/10 or less in RUE during daily tasks.    Time 4   Period Weeks   Status New     OT SHORT TERM GOAL #3   Title Pt will decrease fascial restrictions in RUE to trace amounts to improve mobility of RUE during overhead reaching tasks.    Time 4   Period Weeks   Status New     OT SHORT TERM GOAL #4   Title Pt will increase A/ROM of RUE to [facility] to improve ability to reach into overhead cabinets or shelves during daily tasks.    Time 4   Period Weeks   Status New     OT SHORT TERM GOAL #5   Title Pt will increase RUE strength to 4+/5 to improve ability to lift weighted items above waist level using RUE as dominant.    Time 4   Period Weeks   Status New                  Plan - 06/29/16 1623    Clinical Impression Statement A: Pt is a 60 y/o male presenting with pain, decreased range of motion and strength in the RUE limiting completion of daily tasks using RUE as dominant. Pt was provided with shoulder stretches for HEP this session.    Rehab Potential Good   OT Frequency 2x / week   OT Duration 4 weeks   OT Treatment/Interventions Self-care/ADL training;Therapeutic exercise;Patient/family education;Manual Therapy;Cryotherapy;Therapeutic activities;Electrical Stimulation;Moist Heat;Passive range of motion   Plan P: Pt will benefit from skilled OT services to decrease pain and fascial restrictions, and  increase range of motion and strength in the RUE improving functional task completion with RUE as dominant. Treatment plan: Myofascial release, manual therapy, P/ROM, A/ROM, general RUE strengthening, scapular stability and strengthening,  modalities prn   OT Home Exercise Plan 10/11: shoulder stretches   Consulted and Agree with Plan of Care Patient      Patient will benefit from skilled therapeutic intervention in order to improve the following deficits and impairments:  Impaired flexibility, Decreased strength, Pain, Decreased range of motion, Increased fascial restricitons, Impaired UE functional use  Visit Diagnosis: Acute pain of right shoulder  Stiffness of right shoulder, not elsewhere classified  Other symptoms and signs involving the musculoskeletal system    Problem List Patient Active Problem List   Diagnosis Date Noted  . Coronary artery disease involving native coronary artery of native heart without angina pectoris 05/05/2016  . DM 02/11/2009  . HYPERLIPIDEMIA 02/11/2009  . HTN (hypertension) 02/11/2009  . CAD 02/11/2009   Guadelupe Sabin, OTR/L  [phone] 06/29/2016, 4:30 PM  [place] [address] [place], [place], [address] Phone: [phone]   Fax:  [phone]  Name: Jonathan Walsh MRN: [id number] Date of Birth: 06-05-1956

## 2016-06-30 ENCOUNTER — Ambulatory Visit (HOSPITAL_COMMUNITY): Payer: 59

## 2016-06-30 ENCOUNTER — Encounter (HOSPITAL_COMMUNITY): Payer: Self-pay

## 2016-06-30 DIAGNOSIS — M25611 Stiffness of right shoulder, not elsewhere classified: Secondary | ICD-10-CM

## 2016-06-30 DIAGNOSIS — R29898 Other symptoms and signs involving the musculoskeletal system: Secondary | ICD-10-CM

## 2016-06-30 DIAGNOSIS — M25511 Pain in right shoulder: Secondary | ICD-10-CM | POA: Diagnosis not present

## 2016-07-01 NOTE — Therapy (Signed)
[place], [place], [address] Phone: [phone]   Fax:  [phone]  Occupational Therapy Treatment  Patient Details  Name: Jonathan Walsh MRN: [id number] Date of Birth: Feb 12, 1956 Referring Provider: Dr. Justice Britain  Encounter Date: 06/30/2016      OT End of Session - 07/01/16 0739    Visit Number 2   Number of Visits 8   Date for OT Re-Evaluation 07/29/16   Authorization Type UHC   Authorization Time Period No visit limit: after 30 visits contact insurance company    Authorization - Visit Number 2   Authorization - Number of Visits 30   OT Start Time 1605   OT Stop Time 1647   OT Time Calculation (min) 42 min   Activity Tolerance Patient tolerated treatment well   Behavior During Therapy [facility] for tasks assessed/performed      Past Medical History:  Diagnosis Date  . Coronary artery disease    Angioplasty of OM 2002  . Diverticulosis   . Essential hypertension   . History of colonic polyps    Colonoscopy 2011  . History of non-ST elevation myocardial infarction (NSTEMI)    2002  . Hyperlipidemia   . Nephrolithiasis   . Type 2 diabetes mellitus (HCC)     No past surgical history on file.  There were no vitals filed for this visit.      Subjective Assessment - 07/01/16 0735    Subjective  S: I don't have any problem doing things at waist level but shoulder level is difficult.   Currently in Pain? No/denies            [facility] OT Assessment - 06/30/16 0736      Assessment   Diagnosis Right shoulder adhesive capsulitis; complete right rotator cuff tear     Precautions   Precautions None                  OT Treatments/Exercises (OP) - 06/30/16 1638      Exercises   Exercises Shoulder     Shoulder Exercises: Supine   Protraction PROM;AROM;10 reps   Horizontal ABduction PROM;AROM;10 reps   External Rotation PROM;AROM;10 reps   Internal Rotation PROM;AROM;10 reps   Flexion PROM;AROM;10 reps   ABduction PROM;AROM;10 reps     Shoulder Exercises: Standing   Protraction AROM;10 reps   Horizontal ABduction AAROM;10 reps   External Rotation AAROM;10 reps   Internal Rotation AAROM;10 reps   Flexion AAROM;10 reps   Shoulder Elevation AROM;10 reps     Manual Therapy   Manual Therapy Myofascial release   Manual therapy comments Manual therapy completed prior to exercises.   Myofascial Release Myofascial release and manual stretching completed to right upper arm, trapezius, and scapularis region to decrease fascial restrictions and increase joint mobility in a pain free zone.                 OT Education - 07/01/16 [phone]    Education provided Yes   Education Details Patient was given OT evaluation print out. reviewed plan of care and goals.    Person(s) Educated Patient   Methods Explanation;Handout   Comprehension Verbalized understanding          OT Short Term Goals - 07/01/16 0736      OT SHORT TERM GOAL #1   Title Pt will be provided with and educated on HEP.    Time 4   Period Weeks   Status On-going  OT SHORT TERM GOAL #2   Title Pt will decrease pain to 2/10 or less in RUE during daily tasks.    Time 4   Period Weeks   Status On-going     OT SHORT TERM GOAL #3   Title Pt will decrease fascial restrictions in RUE to trace amounts to improve mobility of RUE during overhead reaching tasks.    Time 4   Period Weeks   Status On-going     OT SHORT TERM GOAL #4   Title Pt will increase A/ROM of RUE to [facility] to improve ability to reach into overhead cabinets or shelves during daily tasks.    Time 4   Period Weeks   Status On-going     OT SHORT TERM GOAL #5   Title Pt will increase RUE strength to 4+/5 to improve ability to lift weighted items at shoulder level or above using RUE as dominant.    Time 4   Period Weeks   Status Revised                  Plan - 07/01/16 0739    Clinical Impression Statement A:  Reviewed portion of HEP per patient's request to ensure proper form and technique. Initiated myofacial release, manual stretching A/ROM and AA/ROM. VC for form and technique.    Plan P: Add wall wash   OT Home Exercise Plan 10/11: shoulder stretches      Patient will benefit from skilled therapeutic intervention in order to improve the following deficits and impairments:  Impaired flexibility, Decreased strength, Pain, Decreased range of motion, Increased fascial restricitons, Impaired UE functional use  Visit Diagnosis: Stiffness of right shoulder, not elsewhere classified  Other symptoms and signs involving the musculoskeletal system    Problem List Patient Active Problem List   Diagnosis Date Noted  . Coronary artery disease involving native coronary artery of native heart without angina pectoris 05/05/2016  . DM 02/11/2009  . HYPERLIPIDEMIA 02/11/2009  . HTN (hypertension) 02/11/2009  . CAD 02/11/2009   Ailene Ravel, OTR/L,CBIS  [phone]  07/01/2016, 7:42 AM  [place], [place], [address] Phone: [phone]   Fax:  [phone]  Name: Jonathan Walsh MRN: [id number] Date of Birth: [birth year]

## 2016-07-06 ENCOUNTER — Ambulatory Visit (HOSPITAL_COMMUNITY): Payer: 59 | Admitting: Occupational Therapy

## 2016-07-06 ENCOUNTER — Encounter (HOSPITAL_COMMUNITY): Payer: Self-pay | Admitting: Occupational Therapy

## 2016-07-06 DIAGNOSIS — R29898 Other symptoms and signs involving the musculoskeletal system: Secondary | ICD-10-CM

## 2016-07-06 DIAGNOSIS — M25511 Pain in right shoulder: Secondary | ICD-10-CM | POA: Diagnosis not present

## 2016-07-06 DIAGNOSIS — M25611 Stiffness of right shoulder, not elsewhere classified: Secondary | ICD-10-CM

## 2016-07-06 NOTE — Patient Instructions (Signed)

## 2016-07-06 NOTE — Therapy (Signed)
Jonathan Walsh, [place], [address] Phone: [phone]   Fax:  [phone]  Occupational Therapy Treatment  Patient Details  Name: Jonathan Walsh MRN: [id number] Date of Birth: 1956-08-06 Referring Provider: Dr. Justice Britain  Encounter Date: 07/06/2016      OT End of Session - 07/06/16 1435    Visit Number 3   Number of Visits 8   Date for OT Re-Evaluation 07/29/16   Authorization Type UHC   Authorization Time Period No visit limit: after 30 visits contact insurance company    Authorization - Visit Number 3   Authorization - Number of Visits 30   OT Start Time 1346   OT Stop Time 1431   OT Time Calculation (min) 45 min   Activity Tolerance Patient tolerated treatment well   Behavior During Therapy [facility] for tasks assessed/performed      Past Medical History:  Diagnosis Date  . Coronary artery disease    Angioplasty of OM 2002  . Diverticulosis   . Essential hypertension   . History of colonic polyps    Colonoscopy 2011  . History of non-ST elevation myocardial infarction (NSTEMI)    2002  . Hyperlipidemia   . Nephrolithiasis   . Type 2 diabetes mellitus (HCC)     No past surgical history on file.  There were no vitals filed for this visit.      Subjective Assessment - 07/06/16 1349    Subjective  S: I've been doing my stretches twice a day.    Currently in Pain? No/denies            [facility] OT Assessment - 07/06/16 1349      Assessment   Diagnosis Right shoulder adhesive capsulitis; complete right rotator cuff tear     Precautions   Precautions None                  OT Treatments/Exercises (OP) - 07/06/16 1350      Exercises   Exercises Shoulder     Shoulder Exercises: Supine   Protraction PROM;AROM;10 reps   Horizontal ABduction PROM;AROM;10 reps   External Rotation PROM;AROM;10 reps   Internal Rotation PROM;AROM;10 reps   Flexion PROM;AROM;10 reps   ABduction  PROM;AROM;10 reps     Shoulder Exercises: Standing   Protraction AROM;10 reps   Horizontal ABduction AROM;10 reps   External Rotation AROM;10 reps   Internal Rotation AROM;10 reps   Flexion AROM;10 reps   ABduction AROM;10 reps     Shoulder Exercises: ROM/Strengthening   Wall Wash 1'   Proximal Shoulder Strengthening, Supine 10X each no rest breaks     Shoulder Exercises: Stretch   External Rotation Stretch 3 reps;10 seconds   Wall Stretch - Flexion 3 reps;10 seconds     Manual Therapy   Manual Therapy Myofascial release   Manual therapy comments Manual therapy completed prior to exercises.   Myofascial Release Myofascial release and manual stretching completed to right upper arm, trapezius, and scapularis region to decrease fascial restrictions and increase joint mobility in a pain free zone.                 OT Education - 07/06/16 1434    Education provided Yes   Education Details A/ROM exercises   Person(s) Educated Patient   Methods Explanation;Demonstration;Handout   Comprehension Verbalized understanding;Returned demonstration          OT Short Term Goals - 07/01/16 [phone]  OT SHORT TERM GOAL #1   Title Pt will be provided with and educated on HEP.    Time 4   Period Weeks   Status On-going     OT SHORT TERM GOAL #2   Title Pt will decrease pain to 2/10 or less in RUE during daily tasks.    Time 4   Period Weeks   Status On-going     OT SHORT TERM GOAL #3   Title Pt will decrease fascial restrictions in RUE to trace amounts to improve mobility of RUE during overhead reaching tasks.    Time 4   Period Weeks   Status On-going     OT SHORT TERM GOAL #4   Title Pt will increase A/ROM of RUE to [facility] to improve ability to reach into overhead cabinets or shelves during daily tasks.    Time 4   Period Weeks   Status On-going     OT SHORT TERM GOAL #5   Title Pt will increase RUE strength to 4+/5 to improve ability to lift weighted items at  shoulder level or above using RUE as dominant.    Time 4   Period Weeks   Status Revised                  Plan - 07/06/16 1435    Clinical Impression Statement A: Completed A/ROM exercises this session, added wall wash and proximal shoulder strengthening. Updated HEP for A/ROM exercises, reviewed with pt and answered questions. Pt required intermittent verbal cuing for form and technique during exercises.    Plan P: Follow up on A/ROM HEP, add sidelying exercises.    OT Home Exercise Plan 10/11: shoulder stretches; 10/18: A/ROM   Consulted and Agree with Plan of Care Patient      Patient will benefit from skilled therapeutic intervention in order to improve the following deficits and impairments:  Impaired flexibility, Decreased strength, Pain, Decreased range of motion, Increased fascial restricitons, Impaired UE functional use  Visit Diagnosis: Stiffness of right shoulder, not elsewhere classified  Other symptoms and signs involving the musculoskeletal system    Problem List Patient Active Problem List   Diagnosis Date Noted  . Coronary artery disease involving native coronary artery of native heart without angina pectoris 05/05/2016  . DM 02/11/2009  . HYPERLIPIDEMIA 02/11/2009  . HTN (hypertension) 02/11/2009  . CAD 02/11/2009   Jonathan Walsh, OTR/L  [phone] 07/06/2016, 2:54 PM  [place] [address] [place], [place], [address] Phone: [phone]   Fax:  [phone]  Name: Jonathan Walsh MRN: [id number] Date of Birth: [birth year]

## 2016-07-07 ENCOUNTER — Encounter (HOSPITAL_COMMUNITY): Payer: 59 | Admitting: Occupational Therapy

## 2016-07-08 ENCOUNTER — Ambulatory Visit (HOSPITAL_COMMUNITY): Payer: 59 | Admitting: Occupational Therapy

## 2016-07-08 ENCOUNTER — Encounter (HOSPITAL_COMMUNITY): Payer: Self-pay | Admitting: Occupational Therapy

## 2016-07-08 DIAGNOSIS — M25611 Stiffness of right shoulder, not elsewhere classified: Secondary | ICD-10-CM

## 2016-07-08 DIAGNOSIS — M25511 Pain in right shoulder: Secondary | ICD-10-CM

## 2016-07-08 DIAGNOSIS — R29898 Other symptoms and signs involving the musculoskeletal system: Secondary | ICD-10-CM

## 2016-07-08 NOTE — Therapy (Signed)
[place], [place], [address] Phone: [phone]   Fax:  [phone]  Occupational Therapy Treatment  Patient Details  Name: Jonathan Walsh MRN: [id number] Date of Birth: August 23, 1956 Referring Provider: Dr. Justice Britain  Encounter Date: 07/08/2016      OT End of Session - 07/08/16 1158    Visit Number 4   Number of Visits 8   Date for OT Re-Evaluation 07/29/16   Authorization Type UHC   Authorization Time Period No visit limit: after 30 visits contact insurance company    Authorization - Visit Number 4   Authorization - Number of Visits 30   OT Start Time 1115   OT Stop Time 1157   OT Time Calculation (min) 42 min   Activity Tolerance Patient tolerated treatment well   Behavior During Therapy [facility] for tasks assessed/performed      Past Medical History:  Diagnosis Date  . Coronary artery disease    Angioplasty of OM 2002  . Diverticulosis   . Essential hypertension   . History of colonic polyps    Colonoscopy 2011  . History of non-ST elevation myocardial infarction (NSTEMI)    2002  . Hyperlipidemia   . Nephrolithiasis   . Type 2 diabetes mellitus (HCC)     No past surgical history on file.  There were no vitals filed for this visit.      Subjective Assessment - 07/08/16 1113    Subjective  S: It was sore yesterday and just a little this morning.    Currently in Pain? No/denies            [facility] OT Assessment - 07/08/16 1112      Assessment   Diagnosis Right shoulder adhesive capsulitis; complete right rotator cuff tear     Precautions   Precautions None                  OT Treatments/Exercises (OP) - 07/08/16 1118      Exercises   Exercises Shoulder     Shoulder Exercises: Supine   Protraction PROM;AROM;10 reps   Horizontal ABduction PROM;AROM;10 reps   External Rotation PROM;AROM;10 reps   Internal Rotation PROM;AROM;10 reps   Flexion PROM;AROM;10 reps    ABduction PROM;AROM;10 reps     Shoulder Exercises: Sidelying   External Rotation AROM;10 reps   Internal Rotation AROM;10 reps   Flexion AROM;10 reps   ABduction AROM;10 reps   Other Sidelying Exercises protraction, A/ROM, 10X   Other Sidelying Exercises horizontal abduction, A/ROM, 10X     Shoulder Exercises: Pulleys   Flexion 1 minute   ABduction 1 minute     Shoulder Exercises: ROM/Strengthening   Wall Wash 1'   Proximal Shoulder Strengthening, Supine 10X each no rest breaks     Shoulder Exercises: Stretch   External Rotation Stretch 3 reps;10 seconds   Wall Stretch - Flexion 3 reps;10 seconds     Manual Therapy   Manual Therapy Myofascial release   Manual therapy comments Manual therapy completed prior to exercises.   Myofascial Release Myofascial release and manual stretching completed to right upper arm, trapezius, and scapularis region to decrease fascial restrictions and increase joint mobility in a pain free zone.                   OT Short Term Goals - 07/01/16 0736      OT SHORT TERM GOAL #1   Title Pt will be provided with and  educated on HEP.    Time 4   Period Weeks   Status On-going     OT SHORT TERM GOAL #2   Title Pt will decrease pain to 2/10 or less in RUE during daily tasks.    Time 4   Period Weeks   Status On-going     OT SHORT TERM GOAL #3   Title Pt will decrease fascial restrictions in RUE to trace amounts to improve mobility of RUE during overhead reaching tasks.    Time 4   Period Weeks   Status On-going     OT SHORT TERM GOAL #4   Title Pt will increase A/ROM of RUE to [facility] to improve ability to reach into overhead cabinets or shelves during daily tasks.    Time 4   Period Weeks   Status On-going     OT SHORT TERM GOAL #5   Title Pt will increase RUE strength to 4+/5 to improve ability to lift weighted items at shoulder level or above using RUE as dominant.    Time 4   Period Weeks   Status Revised                   Plan - 07/08/16 1159    Clinical Impression Statement A: Added sidelying exercises this session, pt with good form, intermittent cuing required. Pt completed pulleys working on improving ROM, cuing required for initial form. Pt reports HEP is going well, no questions.    Plan P: complete standing exercises, add muscle energy technique to work on improving flexion during manual stretching   [place] 10/11: shoulder stretches; 10/18: A/ROM   Consulted and Agree with Plan of Care Patient      Patient will benefit from skilled therapeutic intervention in order to improve the following deficits and impairments:  Impaired flexibility, Decreased strength, Pain, Decreased range of motion, Increased fascial restricitons, Impaired UE functional use  Visit Diagnosis: Stiffness of right shoulder, not elsewhere classified  Other symptoms and signs involving the musculoskeletal system  Acute pain of right shoulder    Problem List Patient Active Problem List   Diagnosis Date Noted  . Coronary artery disease involving native coronary artery of native heart without angina pectoris 05/05/2016  . DM 02/11/2009  . HYPERLIPIDEMIA 02/11/2009  . HTN (hypertension) 02/11/2009  . CAD 02/11/2009   Guadelupe Sabin, OTR/L  [phone] 07/08/2016, 12:01 PM  [place] [address] [place], [place], [address] Phone: [phone]   Fax:  [phone]  Name: Jonathan Walsh MRN: [id number] Date of Birth: [birth year]

## 2016-07-12 ENCOUNTER — Encounter (HOSPITAL_COMMUNITY): Payer: Self-pay

## 2016-07-12 ENCOUNTER — Ambulatory Visit (HOSPITAL_COMMUNITY): Payer: 59

## 2016-07-12 DIAGNOSIS — M25511 Pain in right shoulder: Secondary | ICD-10-CM | POA: Diagnosis not present

## 2016-07-12 DIAGNOSIS — R29898 Other symptoms and signs involving the musculoskeletal system: Secondary | ICD-10-CM

## 2016-07-12 DIAGNOSIS — M25611 Stiffness of right shoulder, not elsewhere classified: Secondary | ICD-10-CM

## 2016-07-12 NOTE — Therapy (Signed)
[place], [place], [address] Phone: [phone]   Fax:  [phone]  Occupational Therapy Treatment  Patient Details  Name: Jonathan Walsh MRN: [id number] Date of Birth: August 12, 1956 Referring Provider: Dr. Justice Britain  Encounter Date: 07/12/2016      OT End of Session - 07/12/16 0938    Visit Number 5   Number of Visits 8   Date for OT Re-Evaluation 07/29/16   Authorization Type UHC   Authorization Time Period No visit limit: after 30 visits contact insurance company    Authorization - Visit Number 5   Authorization - Number of Visits 30   OT Start Time 0902   OT Stop Time 0945   OT Time Calculation (min) 43 min   Activity Tolerance Patient tolerated treatment well   Behavior During Therapy [facility] for tasks assessed/performed      Past Medical History:  Diagnosis Date  . Coronary artery disease    Angioplasty of OM 2002  . Diverticulosis   . Essential hypertension   . History of colonic polyps    Colonoscopy 2011  . History of non-ST elevation myocardial infarction (NSTEMI)    2002  . Hyperlipidemia   . Nephrolithiasis   . Type 2 diabetes mellitus (HCC)     No past surgical history on file.  There were no vitals filed for this visit.      Subjective Assessment - 07/12/16 0915    Subjective  S: it's not 100% but it's better.   Currently in Pain? No/denies            [facility] OT Assessment - 07/12/16 [id number]      Assessment   Diagnosis Right shoulder adhesive capsulitis; complete right rotator cuff tear     Precautions   Precautions None                  OT Treatments/Exercises (OP) - 07/12/16 0918      Exercises   Exercises Shoulder     Shoulder Exercises: Supine   Protraction PROM;5 reps;AROM;12 reps   Horizontal ABduction PROM;5 reps;AROM;12 reps   External Rotation PROM;5 reps;AROM;12 reps   Internal Rotation PROM;5 reps;AROM;12 reps   Flexion PROM;5 reps;AROM;12  reps   ABduction PROM;5 reps;AROM;12 reps     Shoulder Exercises: Sidelying   External Rotation AROM;10 reps   Internal Rotation AROM;10 reps   Flexion AROM;10 reps   ABduction AROM;10 reps   Other Sidelying Exercises protraction, A/ROM, 10X   Other Sidelying Exercises horizontal abduction, A/ROM, 10X     Shoulder Exercises: Standing   Protraction AROM;12 reps   Horizontal ABduction AROM;12 reps   External Rotation AROM;12 reps   Internal Rotation AROM;12 reps   Flexion AROM;12 reps   ABduction AROM;12 reps     Shoulder Exercises: ROM/Strengthening   UBE (Upper Arm Bike) Level 1 2' forward 2' reverse   Proximal Shoulder Strengthening, Supine 12X each no rest breaks   Proximal Shoulder Strengthening, Seated 10X each no rest breaks     Manual Therapy   Manual Therapy Myofascial release;Muscle Energy Technique   Manual therapy comments Manual therapy completed prior to exercises.   Myofascial Release Myofascial release and manual stretching completed to right upper arm, trapezius, and scapularis region to decrease fascial restrictions and increase joint mobility in a pain free zone.    Muscle Energy Technique Muscle energy technique completed to right anterior and medial deltoid to relax tone and muscle spasm and improve  range of motion.                    OT Short Term Goals - 07/12/16 0926      OT SHORT TERM GOAL #1   Title Pt will be provided with and educated on HEP.    Time 4   Period Weeks   Status On-going     OT SHORT TERM GOAL #2   Title Pt will decrease pain to 2/10 or less in RUE during daily tasks.    Time 4   Period Weeks   Status On-going     OT SHORT TERM GOAL #3   Title Pt will decrease fascial restrictions in RUE to trace amounts to improve mobility of RUE during overhead reaching tasks.    Time 4   Period Weeks   Status On-going     OT SHORT TERM GOAL #4   Title Pt will increase A/ROM of RUE to [facility] to improve ability to reach into overhead  cabinets or shelves during daily tasks.    Time 4   Period Weeks   Status On-going     OT SHORT TERM GOAL #5   Title Pt will increase RUE strength to 4+/5 to improve ability to lift weighted items at shoulder level or above using RUE as dominant.    Time 4   Period Weeks   Status On-going                  Plan - 07/12/16 [id number]    Clinical Impression Statement A: Added UBE bike and increased supine and standing repetitions to 12. Added muscle energy technique to increase functional ROM needed for reaching overhead. Min VC for form and technique.   Plan P: Add scapular strengthening exercises.       Patient will benefit from skilled therapeutic intervention in order to improve the following deficits and impairments:  Impaired flexibility, Decreased strength, Pain, Decreased range of motion, Increased fascial restricitons, Impaired UE functional use  Visit Diagnosis: Stiffness of right shoulder, not elsewhere classified  Other symptoms and signs involving the musculoskeletal system    Problem List Patient Active Problem List   Diagnosis Date Noted  . Coronary artery disease involving native coronary artery of native heart without angina pectoris 05/05/2016  . DM 02/11/2009  . HYPERLIPIDEMIA 02/11/2009  . HTN (hypertension) 02/11/2009  . CAD 02/11/2009   Ailene Ravel, OTR/L,CBIS  [phone]  07/12/2016, 9:42 AM  [place] [address] [place], [place], [address] Phone: [phone]   Fax:  [phone]  Name: Jonathan Walsh MRN: [id number] Date of Birth: April 28, 1956

## 2016-07-13 ENCOUNTER — Encounter (HOSPITAL_COMMUNITY): Payer: 59 | Admitting: Occupational Therapy

## 2016-07-14 ENCOUNTER — Encounter (HOSPITAL_COMMUNITY): Payer: Self-pay | Admitting: Occupational Therapy

## 2016-07-14 ENCOUNTER — Ambulatory Visit (HOSPITAL_COMMUNITY): Payer: 59 | Admitting: Occupational Therapy

## 2016-07-14 DIAGNOSIS — R29898 Other symptoms and signs involving the musculoskeletal system: Secondary | ICD-10-CM

## 2016-07-14 DIAGNOSIS — M25611 Stiffness of right shoulder, not elsewhere classified: Secondary | ICD-10-CM

## 2016-07-14 DIAGNOSIS — M25511 Pain in right shoulder: Secondary | ICD-10-CM | POA: Diagnosis not present

## 2016-07-14 NOTE — Therapy (Signed)
[place], [place], [address] Phone: [phone]   Fax:  [phone]  Occupational Therapy Treatment  Patient Details  Name: Jonathan Walsh MRN: [id number] Date of Birth: 1956/04/28 Referring Provider: Dr. Justice Britain  Encounter Date: 07/14/2016      OT End of Session - 07/14/16 1049    Visit Number 6   Number of Visits 8   Date for OT Re-Evaluation 07/29/16   Authorization Type UHC   Authorization Time Period No visit limit: after 30 visits contact insurance company    Authorization - Visit Number 6   Authorization - Number of Visits 30   OT Start Time 0945   OT Stop Time 1029   OT Time Calculation (min) 44 min   Activity Tolerance Patient tolerated treatment well   Behavior During Therapy [facility] for tasks assessed/performed      Past Medical History:  Diagnosis Date  . Coronary artery disease    Angioplasty of OM 2002  . Diverticulosis   . Essential hypertension   . History of colonic polyps    Colonoscopy 2011  . History of non-ST elevation myocardial infarction (NSTEMI)    2002  . Hyperlipidemia   . Nephrolithiasis   . Type 2 diabetes mellitus (HCC)     No past surgical history on file.  There were no vitals filed for this visit.      Subjective Assessment - 07/14/16 0946    Subjective  S: I tried to stretch a little before I came today.    Currently in Pain? No/denies            [facility] OT Assessment - 07/14/16 0945      Assessment   Diagnosis Right shoulder adhesive capsulitis; complete right rotator cuff tear     Precautions   Precautions None                  OT Treatments/Exercises (OP) - 07/14/16 0947      Exercises   Exercises Shoulder     Shoulder Exercises: Supine   Protraction PROM;5 reps;AROM;12 reps   Horizontal ABduction PROM;5 reps;AROM;12 reps   External Rotation PROM;5 reps;AROM;12 reps   External Rotation Limitations shoulders abducted    Internal Rotation PROM;5 reps;AROM;12 reps   Internal Rotation Limitations shoulders abducted   Flexion PROM;5 reps;AROM;12 reps   ABduction PROM;5 reps;AROM;12 reps     Shoulder Exercises: Standing   Protraction AROM;12 reps   Horizontal ABduction AROM;12 reps   External Rotation AROM;12 reps   Internal Rotation AROM;12 reps   Flexion AROM;12 reps   ABduction AROM;12 reps   Extension Theraband;10 reps   Theraband Level (Shoulder Extension) Level 2 (Red)   Row Theraband;10 reps   Theraband Level (Shoulder Row) Level 2 (Red)   Retraction Theraband;10 reps   Theraband Level (Shoulder Retraction) Level 2 (Red)     Shoulder Exercises: ROM/Strengthening   UBE (Upper Arm Bike) Level 1 3' forward 3' reverse   X to V Arms 10X   Proximal Shoulder Strengthening, Supine 12X each no rest breaks   Proximal Shoulder Strengthening, Seated 10X each no rest breaks   Ball on Wall 1' flexion     Shoulder Exercises: Stretch   External Rotation Stretch 3 reps;10 seconds   Wall Stretch - Flexion 3 reps;10 seconds     Manual Therapy   Manual Therapy Myofascial release;Muscle Energy Technique   Manual therapy comments Manual therapy completed prior to exercises.  Myofascial Release Myofascial release and manual stretching completed to right upper arm, trapezius, and scapularis region to decrease fascial restrictions and increase joint mobility in a pain free zone.    Muscle Energy Technique Muscle energy technique completed to right anterior and medial deltoid to relax tone and muscle spasm and improve range of motion.                    OT Short Term Goals - 07/12/16 0926      OT SHORT TERM GOAL #1   Title Pt will be provided with and educated on HEP.    Time 4   Period Weeks   Status On-going     OT SHORT TERM GOAL #2   Title Pt will decrease pain to 2/10 or less in RUE during daily tasks.    Time 4   Period Weeks   Status On-going     OT SHORT TERM GOAL #3   Title Pt will  decrease fascial restrictions in RUE to trace amounts to improve mobility of RUE during overhead reaching tasks.    Time 4   Period Weeks   Status On-going     OT SHORT TERM GOAL #4   Title Pt will increase A/ROM of RUE to [facility] to improve ability to reach into overhead cabinets or shelves during daily tasks.    Time 4   Period Weeks   Status On-going     OT SHORT TERM GOAL #5   Title Pt will increase RUE strength to 4+/5 to improve ability to lift weighted items at shoulder level or above using RUE as dominant.    Time 4   Period Weeks   Status On-going                  Plan - 07/14/16 1049    Clinical Impression Statement A: Added scapular theraband, x to v arms, and ball on wall, pt required intermittent verbal cuing for form. Continued with A/ROM and stretching for RUE. Pt reports HEP is going well and he is noticing decreased tightness at home.    Plan P: Add scapular theraband to HEP; add overhead lacing, prone hughston exercises if pt able to tolerate   OT Home Exercise Plan 10/11: shoulder stretches; 10/18: A/ROM   Consulted and Agree with Plan of Care Patient      Patient will benefit from skilled therapeutic intervention in order to improve the following deficits and impairments:  Impaired flexibility, Decreased strength, Pain, Decreased range of motion, Increased fascial restricitons, Impaired UE functional use  Visit Diagnosis: Stiffness of right shoulder, not elsewhere classified  Other symptoms and signs involving the musculoskeletal system    Problem List Patient Active Problem List   Diagnosis Date Noted  . Coronary artery disease involving native coronary artery of native heart without angina pectoris 05/05/2016  . DM 02/11/2009  . HYPERLIPIDEMIA 02/11/2009  . HTN (hypertension) 02/11/2009  . CAD 02/11/2009   Guadelupe Sabin, OTR/L  [phone] 07/14/2016, 10:51 AM  [place], [place], [address] Phone: [phone]   Fax:  [phone]  Name: Jonathan Walsh MRN: [id number] Date of Birth: 1956-07-29

## 2016-07-15 ENCOUNTER — Encounter (HOSPITAL_COMMUNITY): Payer: 59 | Admitting: Occupational Therapy

## 2016-07-20 ENCOUNTER — Ambulatory Visit (HOSPITAL_COMMUNITY): Payer: 59 | Attending: Orthopedic Surgery | Admitting: Occupational Therapy

## 2016-07-20 ENCOUNTER — Encounter (HOSPITAL_COMMUNITY): Payer: Self-pay | Admitting: Occupational Therapy

## 2016-07-20 DIAGNOSIS — M25611 Stiffness of right shoulder, not elsewhere classified: Secondary | ICD-10-CM | POA: Diagnosis present

## 2016-07-20 DIAGNOSIS — M25511 Pain in right shoulder: Secondary | ICD-10-CM | POA: Diagnosis present

## 2016-07-20 DIAGNOSIS — R29898 Other symptoms and signs involving the musculoskeletal system: Secondary | ICD-10-CM | POA: Diagnosis present

## 2016-07-20 NOTE — Therapy (Signed)
[place], [place], [address] Phone: [phone]   Fax:  [phone]  Occupational Therapy Treatment  Patient Details  Name: Jonathan Walsh MRN: [id number] Date of Birth: 1956-05-18 Referring Provider: Dr. Justice Britain  Encounter Date: 07/20/2016      OT End of Session - 07/20/16 1434    Visit Number 7   Number of Visits 8   Date for OT Re-Evaluation 07/29/16   Authorization Type UHC   Authorization Time Period No visit limit: after 30 visits contact insurance company    Authorization - Visit Number 7   Authorization - Number of Visits 30   OT Start Time 1346   OT Stop Time 1433   OT Time Calculation (min) 47 min   Activity Tolerance Patient tolerated treatment well   Behavior During Therapy [facility] for tasks assessed/performed      Past Medical History:  Diagnosis Date  . Coronary artery disease    Angioplasty of OM 2002  . Diverticulosis   . Essential hypertension   . History of colonic polyps    Colonoscopy 2011  . History of non-ST elevation myocardial infarction (NSTEMI)    2002  . Hyperlipidemia   . Nephrolithiasis   . Type 2 diabetes mellitus (HCC)     No past surgical history on file.  There were no vitals filed for this visit.      Subjective Assessment - 07/20/16 1347    Subjective  S: I missed two days of exercise over the weekend.    Currently in Pain? No/denies            [facility] OT Assessment - 07/20/16 1347      Assessment   Diagnosis Right shoulder adhesive capsulitis; complete right rotator cuff tear     Precautions   Precautions None                  OT Treatments/Exercises (OP) - 07/20/16 1348      Exercises   Exercises Shoulder     Shoulder Exercises: Supine   Protraction PROM;5 reps;AROM;12 reps   Horizontal ABduction PROM;5 reps;AROM;12 reps   External Rotation PROM;5 reps;AROM;12 reps   External Rotation Limitations shoulders abducted   Internal Rotation PROM;5 reps;AROM;12 reps   Internal Rotation Limitations shoulders abducted   Flexion PROM;5 reps;AROM;12 reps   ABduction PROM;5 reps;AROM;12 reps     Shoulder Exercises: Prone   Other Prone Exercises prone hughston exercises, 10X each, 5 positions     Shoulder Exercises: Standing   Extension Theraband;15 reps   Theraband Level (Shoulder Extension) Level 2 (Red)   Row Theraband;15 reps   Theraband Level (Shoulder Row) Level 2 (Red)   Retraction Theraband;15 reps   Theraband Level (Shoulder Retraction) Level 2 (Red)     Shoulder Exercises: ROM/Strengthening   UBE (Upper Arm Bike) Level 1 3' forward 3' reverse   X to V Arms 10X   Proximal Shoulder Strengthening, Supine 12X each no rest breaks   Proximal Shoulder Strengthening, Seated 10X each no rest breaks     Manual Therapy   Manual Therapy Myofascial release;Muscle Energy Technique   Manual therapy comments Manual therapy completed prior to exercises.   Myofascial Release Myofascial release and manual stretching completed to right upper arm, trapezius, and scapularis region to decrease fascial restrictions and increase joint mobility in a pain free zone.    Muscle Energy Technique Muscle energy technique completed to right anterior and medial deltoid to relax  tone and muscle spasm and improve range of motion.                  OT Education - 07/20/16 1433    Education provided Yes   Education Details scapular theraband-red   Person(s) Educated Patient   Methods Explanation;Demonstration;Handout   Comprehension Verbalized understanding;Returned demonstration          OT Short Term Goals - 07/12/16 0926      OT SHORT TERM GOAL #1   Title Pt will be provided with and educated on HEP.    Time 4   Period Weeks   Status On-going     OT SHORT TERM GOAL #2   Title Pt will decrease pain to 2/10 or less in RUE during daily tasks.    Time 4   Period Weeks   Status On-going     OT SHORT TERM GOAL  #3   Title Pt will decrease fascial restrictions in RUE to trace amounts to improve mobility of RUE during overhead reaching tasks.    Time 4   Period Weeks   Status On-going     OT SHORT TERM GOAL #4   Title Pt will increase A/ROM of RUE to [facility] to improve ability to reach into overhead cabinets or shelves during daily tasks.    Time 4   Period Weeks   Status On-going     OT SHORT TERM GOAL #5   Title Pt will increase RUE strength to 4+/5 to improve ability to lift weighted items at shoulder level or above using RUE as dominant.    Time 4   Period Weeks   Status On-going                  Plan - 07/20/16 1434    Clinical Impression Statement A: Added prone hughston exercises, increased scapular theraband to 15 repetitions, added overhead lacing. Continued with A/ROM, OT notes popping and greater range during manual stretching. Pt continues to have ROM deficits, however is demonstrating improvements. Added scapular theraband to HEP.    Plan P: Reassessment and recertification, continue with stretches, resume sidelying exercises   OT Home Exercise Plan 10/11: shoulder stretches; 10/18: A/ROM; 11/1: scapular theraband      Patient will benefit from skilled therapeutic intervention in order to improve the following deficits and impairments:  Impaired flexibility, Decreased strength, Pain, Decreased range of motion, Increased fascial restricitons, Impaired UE functional use  Visit Diagnosis: Stiffness of right shoulder, not elsewhere classified  Other symptoms and signs involving the musculoskeletal system    Problem List Patient Active Problem List   Diagnosis Date Noted  . Coronary artery disease involving native coronary artery of native heart without angina pectoris 05/05/2016  . DM 02/11/2009  . HYPERLIPIDEMIA 02/11/2009  . HTN (hypertension) 02/11/2009  . CAD 02/11/2009   Guadelupe Sabin, OTR/L  [phone] 07/20/2016, 2:37 PM  [place] [address] [place], [place], [address] Phone: [phone]   Fax:  [phone]  Name: Jonathan Walsh MRN: [id number] Date of Birth: 19-Nov-1955

## 2016-07-20 NOTE — Patient Instructions (Signed)
Complete 1-2x/day  (Home) Extension: Isometric / Bilateral Arm Retraction - Sitting   Facing anchor, hold hands and elbow at shoulder height, with elbow bent.  Pull arms back to squeeze shoulder blades together. Repeat 10-15 times.  Copyright  V[employer]. All rights reserved.   (Home) Retraction: Row - Bilateral (Anchor)   Facing anchor, arms reaching forward, pull hands toward stomach, keeping elbows bent and at your sides and pinching shoulder blades together. Repeat 10-15 times.  Copyright  VHI. All rights reserved.   (Clinic) Extension / Flexion (Assist)   Face anchor, pull arms back, keeping elbow straight, and squeze shoulder blades together. Repeat 10-15 times.   Copyright  VHI. All rights reserved.

## 2016-07-22 ENCOUNTER — Encounter (HOSPITAL_COMMUNITY): Payer: Self-pay | Admitting: Occupational Therapy

## 2016-07-22 ENCOUNTER — Ambulatory Visit (HOSPITAL_COMMUNITY): Payer: 59 | Admitting: Occupational Therapy

## 2016-07-22 DIAGNOSIS — M25611 Stiffness of right shoulder, not elsewhere classified: Secondary | ICD-10-CM

## 2016-07-22 DIAGNOSIS — R29898 Other symptoms and signs involving the musculoskeletal system: Secondary | ICD-10-CM

## 2016-07-22 NOTE — Therapy (Signed)
[place], [place], [address] Phone: [phone]   Fax:  [phone]  Occupational Therapy Reassessment, and Treatment (Recertification)  Patient Details  Name: Jonathan Walsh MRN: [id number] Date of Birth: 1956/07/11 Referring Provider: Dr. Justice Britain  Encounter Date: 07/22/2016      OT End of Session - 07/22/16 1504    Visit Number 8   Number of Visits 16   Date for OT Re-Evaluation 08/26/16   Authorization Type UHC   Authorization Time Period No visit limit: after 30 visits contact insurance company    Authorization - Visit Number 8   Authorization - Number of Visits 30   OT Start Time 1300   OT Stop Time 1345   OT Time Calculation (min) 45 min   Activity Tolerance Patient tolerated treatment well   Behavior During Therapy [facility] for tasks assessed/performed      Past Medical History:  Diagnosis Date  . Coronary artery disease    Angioplasty of OM 2002  . Diverticulosis   . Essential hypertension   . History of colonic polyps    Colonoscopy 2011  . History of non-ST elevation myocardial infarction (NSTEMI)    2002  . Hyperlipidemia   . Nephrolithiasis   . Type 2 diabetes mellitus (HCC)     No past surgical history on file.  There were no vitals filed for this visit.      Subjective Assessment - 07/22/16 1301    Subjective  S: I go back to the doctor on Monday.    Currently in Pain? No/denies           [facility] OT Assessment - 07/22/16 1300      Assessment   Diagnosis Right shoulder adhesive capsulitis; complete right rotator cuff tear     Precautions   Precautions None     Palpation   Palpation comment Min fascial restrictions in right upper arm and anterior deltoid regions     AROM   Overall AROM Comments Assessed seated, er/IR adducted   AROM Assessment Site Shoulder   Right/Left Shoulder Right   Right Shoulder Flexion 140 Degrees  110 previous   Right Shoulder ABduction  153 Degrees  125 previous   Right Shoulder Internal Rotation 90 Degrees  same as previous   Right Shoulder External Rotation 74 Degrees  65 previous     PROM   Overall PROM Comments Assessed supine, er/IR adducted   PROM Assessment Site Shoulder   Right/Left Shoulder Right   Right Shoulder Flexion 164 Degrees  155 previous   Right Shoulder ABduction 180 Degrees  135 previous   Right Shoulder Internal Rotation 90 Degrees  same as previous   Right Shoulder External Rotation 80 Degrees  73 previous     Strength   Overall Strength Comments Assessed seated, er/IR adducted   Strength Assessment Site Shoulder   Right/Left Shoulder Right   Right Shoulder Flexion 4/5  same as previous   Right Shoulder ABduction 4/5  4-/5 previous   Right Shoulder Internal Rotation 4+/5  4/5 previous   Right Shoulder External Rotation 4/5  same as previous                  OT Treatments/Exercises (OP) - 07/22/16 1306      Exercises   Exercises Shoulder     Shoulder Exercises: Supine   Protraction PROM;5 reps   Horizontal ABduction PROM;5 reps   External Rotation PROM;5 reps   Internal  Rotation PROM;5 reps   Flexion PROM;5 reps   ABduction PROM;5 reps     Shoulder Exercises: Standing   Protraction AROM;15 reps   Horizontal ABduction AROM;15 reps   External Rotation AROM;15 reps   Internal Rotation AROM;15 reps   Flexion AROM;15 reps   ABduction AROM;15 reps   Extension Theraband;15 reps   Theraband Level (Shoulder Extension) Level 2 (Red)   Row Theraband;15 reps   Theraband Level (Shoulder Row) Level 2 (Red)   Retraction Theraband;15 reps   Theraband Level (Shoulder Retraction) Level 2 (Red)     Shoulder Exercises: ROM/Strengthening   X to V Arms 10X   Proximal Shoulder Strengthening, Seated 12X each no rest breaks   Other ROM/Strengthening Exercises standing field goal, 10X, A/ROM     Shoulder Exercises: Stretch   Internal Rotation Stretch 3 reps  10 seconds    External Rotation Stretch 3 reps;10 seconds   Wall Stretch - Flexion 3 reps;10 seconds     Manual Therapy   Manual Therapy Myofascial release;Muscle Energy Technique   Manual therapy comments Manual therapy completed prior to exercises.   Myofascial Release Myofascial release and manual stretching completed to right upper arm, trapezius, and scapularis region to decrease fascial restrictions and increase joint mobility in a pain free zone.    Muscle Energy Technique Muscle energy technique completed to right anterior and medial deltoid to relax tone and muscle spasm and improve range of motion.                   OT Short Term Goals - 07/22/16 1505      OT SHORT TERM GOAL #1   Title Pt will be provided with and educated on HEP.    Time 4   Period Weeks   Status On-going     OT SHORT TERM GOAL #2   Title Pt will decrease pain to 2/10 or less in RUE during daily tasks.    Time 4   Period Weeks   Status Achieved     OT SHORT TERM GOAL #3   Title Pt will decrease fascial restrictions in RUE to trace amounts to improve mobility of RUE during overhead reaching tasks.    Time 4   Period Weeks   Status On-going     OT SHORT TERM GOAL #4   Title Pt will increase A/ROM of RUE to [facility] to improve ability to reach into overhead cabinets or shelves during daily tasks.    Time 4   Period Weeks   Status Achieved     OT SHORT TERM GOAL #5   Title Pt will increase RUE strength to 4+/5 to improve ability to lift weighted items at shoulder level or above using RUE as dominant.    Time 4   Period Weeks   Status On-going                  Plan - 07/22/16 1505    Clinical Impression Statement A: Reassessment completed this session, pt has met 2/5 STGs and is making great progress in ROM, strength, and functional use of RUE. Pt reports minimal soreness in RUE, mainly after prolonged use. Pt would benefit from continued OT services focusing on increasing strength in RUE and  continuing to improve A/ROM.    Rehab Potential Good   OT Frequency 2x / week   OT Duration 4 weeks   OT Treatment/Interventions Self-care/ADL training;Therapeutic exercise;Patient/family education;Manual Therapy;Cryotherapy;Therapeutic activities;Electrical Stimulation;Moist Heat;Passive range of motion   Plan  P: Continue skilled OT services 2x/week for 4 weeks with focus on end range A/ROM and strengthening of RUE. Next treatment session: Add 1# weight to supine exercises, w arms.    OT Home Exercise Plan 10/11: shoulder stretches; 10/18: A/ROM; 11/1: scapular theraband   Consulted and Agree with Plan of Care Patient      Patient will benefit from skilled therapeutic intervention in order to improve the following deficits and impairments:  Impaired flexibility, Decreased strength, Pain, Decreased range of motion, Increased fascial restricitons, Impaired UE functional use  Visit Diagnosis: Stiffness of right shoulder, not elsewhere classified  Other symptoms and signs involving the musculoskeletal system    Problem List Patient Active Problem List   Diagnosis Date Noted  . Coronary artery disease involving native coronary artery of native heart without angina pectoris 05/05/2016  . DM 02/11/2009  . HYPERLIPIDEMIA 02/11/2009  . HTN (hypertension) 02/11/2009  . CAD 02/11/2009   Guadelupe Sabin, OTR/L  [phone] 07/22/2016, 4:32 PM  [facility] [address] [place], [place], [address] Phone: [phone]   Fax:  [phone]  Name: Jonathan Walsh MRN: [id number] Date of Birth: Mar 11, 1956

## 2016-07-27 ENCOUNTER — Encounter (HOSPITAL_COMMUNITY): Payer: Self-pay | Admitting: Occupational Therapy

## 2016-07-27 ENCOUNTER — Ambulatory Visit (HOSPITAL_COMMUNITY): Payer: 59 | Admitting: Occupational Therapy

## 2016-07-27 DIAGNOSIS — M25611 Stiffness of right shoulder, not elsewhere classified: Secondary | ICD-10-CM

## 2016-07-27 DIAGNOSIS — R29898 Other symptoms and signs involving the musculoskeletal system: Secondary | ICD-10-CM

## 2016-07-27 NOTE — Therapy (Signed)
[place], [place], [address] Phone: [phone]   Fax:  [phone]  Occupational Therapy Treatment  Patient Details  Name: Jonathan Walsh MRN: [id number] Date of Birth: [birth year] Referring Provider: Dr. Justice Britain  Encounter Date: 07/27/2016      OT End of Session - 07/27/16 1529    Visit Number 9   Number of Visits 16   Date for OT Re-Evaluation 08/26/16   Authorization Type UHC   Authorization Time Period No visit limit: after 30 visits contact insurance company    Authorization - Visit Number 9   Authorization - Number of Visits 30   OT Start Time [id number]   OT Stop Time 1430   OT Time Calculation (min) 43 min   Activity Tolerance Patient tolerated treatment well   Behavior During Therapy [facility] for tasks assessed/performed      Past Medical History:  Diagnosis Date  . Coronary artery disease    Angioplasty of OM 2002  . Diverticulosis   . Essential hypertension   . History of colonic polyps    Colonoscopy 2011  . History of non-ST elevation myocardial infarction (NSTEMI)    2002  . Hyperlipidemia   . Nephrolithiasis   . Type 2 diabetes mellitus (HCC)     No past surgical history on file.  There were no vitals filed for this visit.      Subjective Assessment - 07/27/16 1347    Subjective  S: I don't go back to the doctor unless I need to.    Currently in Pain? No/denies            [facility] OT Assessment - 07/27/16 1347      Assessment   Diagnosis Right shoulder adhesive capsulitis; complete right rotator cuff tear     Precautions   Precautions None                  OT Treatments/Exercises (OP) - 07/27/16 1351      Exercises   Exercises Shoulder     Shoulder Exercises: Supine   Protraction PROM;5 reps;Strengthening;12 reps   Protraction Weight (lbs) 1   Horizontal ABduction PROM;5 reps;Strengthening;12 reps   Horizontal ABduction Weight (lbs) 1   External Rotation  PROM;5 reps;Strengthening;12 reps   External Rotation Weight (lbs) 1   Internal Rotation PROM;5 reps;Strengthening;12 reps   Internal Rotation Weight (lbs) 1   Flexion PROM;5 reps;Strengthening;12 reps   Shoulder Flexion Weight (lbs) 1   ABduction PROM;5 reps;Strengthening;12 reps   Shoulder ABduction Weight (lbs) 1     Shoulder Exercises: Sidelying   External Rotation Strengthening;12 reps   External Rotation Weight (lbs) 1   Internal Rotation Strengthening;12 reps   Internal Rotation Weight (lbs) 1   Flexion Strengthening;12 reps   Flexion Weight (lbs) 1   ABduction Strengthening;12 reps   ABduction Weight (lbs) 1   Other Sidelying Exercises protraction, 10X, 1# weight   Other Sidelying Exercises horizontal abduction, 10X, 1# weight     Shoulder Exercises: Standing   Extension Theraband;15 reps   Theraband Level (Shoulder Extension) Level 2 (Red)   Row Theraband;15 reps   Theraband Level (Shoulder Row) Level 2 (Red)   Retraction Theraband;15 reps   Theraband Level (Shoulder Retraction) Level 2 (Red)     Shoulder Exercises: ROM/Strengthening   "W" Arms 10X   X to V Arms 12X   Proximal Shoulder Strengthening, Supine 12X each with 1# weight, no rest breaks   Ball on  Wall 1' flexion 1' abduction   Other ROM/Strengthening Exercises standing field goal, 10X, A/ROM     Manual Therapy   Manual Therapy Myofascial release;Muscle Energy Technique   Manual therapy comments Manual therapy completed prior to exercises.   Myofascial Release Myofascial release and manual stretching completed to right upper arm, trapezius, and scapularis region to decrease fascial restrictions and increase joint mobility in a pain free zone.    Muscle Energy Technique Muscle energy technique completed to right anterior and medial deltoid to relax tone and muscle spasm and improve range of motion.                    OT Short Term Goals - 07/22/16 1505      OT SHORT TERM GOAL #1   Title Pt  will be provided with and educated on HEP.    Time 4   Period Weeks   Status On-going     OT SHORT TERM GOAL #2   Title Pt will decrease pain to 2/10 or less in RUE during daily tasks.    Time 4   Period Weeks   Status Achieved     OT SHORT TERM GOAL #3   Title Pt will decrease fascial restrictions in RUE to trace amounts to improve mobility of RUE during overhead reaching tasks.    Time 4   Period Weeks   Status On-going     OT SHORT TERM GOAL #4   Title Pt will increase A/ROM of RUE to [facility] to improve ability to reach into overhead cabinets or shelves during daily tasks.    Time 4   Period Weeks   Status Achieved     OT SHORT TERM GOAL #5   Title Pt will increase RUE strength to 4+/5 to improve ability to lift weighted items at shoulder level or above using RUE as dominant.    Time 4   Period Weeks   Status On-going                  Plan - 07/27/16 1530    Clinical Impression Statement A: Added 1# weight to supine and sidelying exercises, added w arms and ball on wall in abduction. Pt with improved form during x to v and standing field goal, minimal verbal cuing required. Pt reports MD has released him unless he has additional pain/weakness with the RUE.    Plan P: Add 1# weight to standing exercises, increase theraband from red to green and provide green for home.    OT Home Exercise Plan 10/11: shoulder stretches; 10/18: A/ROM; 11/1: scapular theraband      Patient will benefit from skilled therapeutic intervention in order to improve the following deficits and impairments:  Impaired flexibility, Decreased strength, Pain, Decreased range of motion, Increased fascial restricitons, Impaired UE functional use  Visit Diagnosis: Stiffness of right shoulder, not elsewhere classified  Other symptoms and signs involving the musculoskeletal system    Problem List Patient Active Problem List   Diagnosis Date Noted  . Coronary artery disease involving native  coronary artery of native heart without angina pectoris 05/05/2016  . DM 02/11/2009  . HYPERLIPIDEMIA 02/11/2009  . HTN (hypertension) 02/11/2009  . CAD 02/11/2009   Guadelupe Sabin, OTR/L  [phone] 07/27/2016, 3:32 PM  [facility] [address] [place], [place], [address] Phone: [phone]   Fax:  [phone]  Name: ABHISHEK HUNSICKER MRN: [id number] Date of Birth: May 20, 1956

## 2016-07-29 ENCOUNTER — Encounter (HOSPITAL_COMMUNITY): Payer: Self-pay | Admitting: Occupational Therapy

## 2016-07-29 ENCOUNTER — Ambulatory Visit (HOSPITAL_COMMUNITY): Payer: 59 | Admitting: Occupational Therapy

## 2016-07-29 DIAGNOSIS — M25611 Stiffness of right shoulder, not elsewhere classified: Secondary | ICD-10-CM

## 2016-07-29 DIAGNOSIS — R29898 Other symptoms and signs involving the musculoskeletal system: Secondary | ICD-10-CM

## 2016-07-29 NOTE — Therapy (Signed)
[place], [place], [address] Phone: [phone]   Fax:  [phone]  Occupational Therapy Treatment  Patient Details  Name: Jonathan Walsh MRN: [id number] Date of Birth: 01/16/1956 Referring Provider: Dr. Justice Britain  Encounter Date: 07/29/2016      OT End of Session - 07/29/16 1432    Visit Number 10   Number of Visits 16   Date for OT Re-Evaluation 08/26/16   Authorization Type UHC   Authorization Time Period No visit limit: after 30 visits contact insurance company    Authorization - Visit Number 10   Authorization - Number of Visits 30   OT Start Time 1300   OT Stop Time 1345   OT Time Calculation (min) 45 min   Activity Tolerance Patient tolerated treatment well   Behavior During Therapy [facility] for tasks assessed/performed      Past Medical History:  Diagnosis Date  . Coronary artery disease    Angioplasty of OM 2002  . Diverticulosis   . Essential hypertension   . History of colonic polyps    Colonoscopy 2011  . History of non-ST elevation myocardial infarction (NSTEMI)    2002  . Hyperlipidemia   . Nephrolithiasis   . Type 2 diabetes mellitus (HCC)     No past surgical history on file.  There were no vitals filed for this visit.      Subjective Assessment - 07/29/16 1259    Subjective  S: I did feel just a little bit of soreness after last session.    Currently in Pain? No/denies            [facility] OT Assessment - 07/29/16 1259      Assessment   Diagnosis Right shoulder adhesive capsulitis; complete right rotator cuff tear     Precautions   Precautions None                  OT Treatments/Exercises (OP) - 07/29/16 1303      Exercises   Exercises Shoulder     Shoulder Exercises: Supine   Protraction PROM;5 reps;Strengthening;12 reps   Protraction Weight (lbs) 1   Horizontal ABduction PROM;5 reps;Strengthening;12 reps   Horizontal ABduction Weight (lbs) 1   External Rotation PROM;5 reps;Strengthening;12 reps   External Rotation Weight (lbs) 1   External Rotation Limitations shoulders abducted   Internal Rotation PROM;5 reps;Strengthening;12 reps   Internal Rotation Weight (lbs) 1   Internal Rotation Limitations shoulders abducted   Flexion PROM;5 reps;Strengthening;12 reps   Shoulder Flexion Weight (lbs) 1   ABduction PROM;5 reps;Strengthening;12 reps   Shoulder ABduction Weight (lbs) 1     Shoulder Exercises: Standing   Protraction Strengthening;12 reps   Protraction Weight (lbs) 1   Horizontal ABduction Strengthening;12 reps   Horizontal ABduction Weight (lbs) 1   External Rotation Strengthening;12 reps   External Rotation Weight (lbs) 1   External Rotation Limitations abducted   Internal Rotation Strengthening;12 reps   Internal Rotation Weight (lbs) 1   Internal Rotation Limitations abducted   Flexion Strengthening;12 reps   Shoulder Flexion Weight (lbs) 1   ABduction Strengthening;12 reps   Shoulder ABduction Weight (lbs) 1   Extension Theraband;12 reps   Theraband Level (Shoulder Extension) Level 3 (Green)   Row [employer] reps   Theraband Level (Shoulder Row) Level 3 (Green)   Retraction Theraband;12 reps   Theraband Level (Shoulder Retraction) Level 3 (Green)     Shoulder Exercises: ROM/Strengthening   UBE (Upper  Arm Bike) Level 2 2' forward 2' reverse   Over Head Lace 1'   "W" Arms 10X   X to V Arms 12X, 1# weight   Proximal Shoulder Strengthening, Supine 12X each with 1# weight, no rest breaks   Proximal Shoulder Strengthening, Seated 12X each, 1# weight no rest breaks   Ball on Wall 1' flexion 1' abduction   Other ROM/Strengthening Exercises standing field goal, 12X, A/ROM     Shoulder Exercises: Stretch   Internal Rotation Stretch 3 reps  10 seconds   External Rotation Stretch 3 reps;10 seconds     Manual Therapy   Manual Therapy Myofascial release;Muscle Energy Technique   Manual therapy comments Manual  therapy completed prior to exercises.   Myofascial Release Myofascial release and manual stretching completed to right upper arm, trapezius, and scapularis region to decrease fascial restrictions and increase joint mobility in a pain free zone.    Muscle Energy Technique Muscle energy technique completed to right anterior and medial deltoid to relax tone and muscle spasm and improve range of motion.                    OT Short Term Goals - 07/22/16 1505      OT SHORT TERM GOAL #1   Title Pt will be provided with and educated on HEP.    Time 4   Period Weeks   Status On-going     OT SHORT TERM GOAL #2   Title Pt will decrease pain to 2/10 or less in RUE during daily tasks.    Time 4   Period Weeks   Status Achieved     OT SHORT TERM GOAL #3   Title Pt will decrease fascial restrictions in RUE to trace amounts to improve mobility of RUE during overhead reaching tasks.    Time 4   Period Weeks   Status On-going     OT SHORT TERM GOAL #4   Title Pt will increase A/ROM of RUE to [facility] to improve ability to reach into overhead cabinets or shelves during daily tasks.    Time 4   Period Weeks   Status Achieved     OT SHORT TERM GOAL #5   Title Pt will increase RUE strength to 4+/5 to improve ability to lift weighted items at shoulder level or above using RUE as dominant.    Time 4   Period Weeks   Status On-going                  Plan - 07/29/16 1432    Clinical Impression Statement A: Added 1# weight to standing exercises, x to v arms, and proximal shoulder strengthening. Increased scapular theraband to green band, added overhead lacing. Pt with min fatigue at end of session, rest breaks provided as needed during session. Pt with good form, intermittent verbal cuing for corrections.    Plan P: MFR prn. Add cybex row/press, wall push ups to further improve strength necessary for ADL completion.    OT Home Exercise Plan 10/11: shoulder stretches; 10/18: A/ROM;  11/1: scapular theraband      Patient will benefit from skilled therapeutic intervention in order to improve the following deficits and impairments:  Impaired flexibility, Decreased strength, Pain, Decreased range of motion, Increased fascial restricitons, Impaired UE functional use  Visit Diagnosis: Stiffness of right shoulder, not elsewhere classified  Other symptoms and signs involving the musculoskeletal system    Problem List Patient Active Problem List  Diagnosis Date Noted  . Coronary artery disease involving native coronary artery of native heart without angina pectoris 05/05/2016  . DM 02/11/2009  . HYPERLIPIDEMIA 02/11/2009  . HTN (hypertension) 02/11/2009  . CAD 02/11/2009   Guadelupe Sabin, OTR/L  [phone] 07/29/2016, 2:34 PM  [facility] [address] [place], [place], [address] Phone: [phone]   Fax:  [phone]  Name: Jonathan Walsh MRN: [id number] Date of Birth: [birth year]

## 2016-08-02 ENCOUNTER — Encounter (HOSPITAL_COMMUNITY): Payer: Self-pay | Admitting: Occupational Therapy

## 2016-08-02 ENCOUNTER — Ambulatory Visit (HOSPITAL_COMMUNITY): Payer: 59 | Admitting: Occupational Therapy

## 2016-08-02 DIAGNOSIS — R29898 Other symptoms and signs involving the musculoskeletal system: Secondary | ICD-10-CM

## 2016-08-02 DIAGNOSIS — M25511 Pain in right shoulder: Secondary | ICD-10-CM

## 2016-08-02 DIAGNOSIS — M25611 Stiffness of right shoulder, not elsewhere classified: Secondary | ICD-10-CM

## 2016-08-02 NOTE — Therapy (Signed)
[place], [place], [address] Phone: [phone]   Fax:  [phone]  Occupational Therapy Treatment  Patient Details  Name: Jonathan Walsh MRN: [id number] Date of Birth: 1956-08-29 Referring Provider: Dr. Justice Britain  Encounter Date: 08/02/2016      OT End of Session - 08/02/16 1555    Visit Number 11   Number of Visits 16   Date for OT Re-Evaluation 08/26/16   Authorization Type UHC   Authorization Time Period No visit limit: after 30 visits contact insurance company    Authorization - Visit Number 11   Authorization - Number of Visits 30   OT Start Time [id number]   OT Stop Time 1600   OT Time Calculation (min) 41 min   Activity Tolerance Patient tolerated treatment well   Behavior During Therapy [facility] for tasks assessed/performed      Past Medical History:  Diagnosis Date  . Coronary artery disease    Angioplasty of OM 2002  . Diverticulosis   . Essential hypertension   . History of colonic polyps    Colonoscopy 2011  . History of non-ST elevation myocardial infarction (NSTEMI)    2002  . Hyperlipidemia   . Nephrolithiasis   . Type 2 diabetes mellitus (HCC)     No past surgical history on file.  There were no vitals filed for this visit.      Subjective Assessment - 08/02/16 1523    Subjective  S: It started aching just a little bit yesterday.    Currently in Pain? Yes   Pain Score 1    Pain Location Shoulder   Pain Orientation Right   Pain Descriptors / Indicators Aching   Pain Type Acute pain   Pain Radiating Towards none   Pain Onset Yesterday   Pain Frequency Intermittent   Aggravating Factors  none   Pain Relieving Factors stretching, ice   Effect of Pain on Daily Activities none   Multiple Pain Sites No            [facility] OT Assessment - 08/02/16 1522      Assessment   Diagnosis Right shoulder adhesive capsulitis; complete right rotator cuff tear     Precautions   Precautions None                  OT Treatments/Exercises (OP) - 08/02/16 1526      Exercises   Exercises Shoulder     Shoulder Exercises: Supine   Protraction PROM;5 reps;Strengthening;12 reps   Protraction Weight (lbs) 1   Horizontal ABduction PROM;5 reps;Strengthening;12 reps   Horizontal ABduction Weight (lbs) 1   External Rotation PROM;5 reps;Strengthening;12 reps   External Rotation Weight (lbs) 1   External Rotation Limitations shoulders abducted   Internal Rotation PROM;5 reps;Strengthening;12 reps   Internal Rotation Weight (lbs) 1   Internal Rotation Limitations shoulders abducted   Flexion PROM;5 reps;Strengthening;12 reps   Shoulder Flexion Weight (lbs) 1   ABduction PROM;5 reps;Strengthening;12 reps   Shoulder ABduction Weight (lbs) 1     Shoulder Exercises: Standing   Protraction Theraband;10 reps   Theraband Level (Shoulder Protraction) Level 3 (Green)   Horizontal ABduction Theraband;10 reps   Theraband Level (Shoulder Horizontal ABduction) Level 3 (Green)   External Rotation Theraband;10 reps   Theraband Level (Shoulder External Rotation) Level 3 (Green)   Internal Rotation Theraband;10 reps   Theraband Level (Shoulder Internal Rotation) Level 3 (Green)   Flexion Theraband;10 reps   Theraband  Level (Shoulder Flexion) Level 3 (Green)   ABduction Theraband;10 reps   Theraband Level (Shoulder ABduction) Level 3 (Green)     Shoulder Exercises: ROM/Strengthening   UBE (Upper Arm Bike) Level 2 3' forward 3' reverse   Cybex Press 2 plate;10 reps   Cybex Row 2 plate;10 reps   Wall Pushups 10 reps   Proximal Shoulder Strengthening, Supine 12X each with 1# weight, no rest breaks     Manual Therapy   Manual Therapy Myofascial release;Muscle Energy Technique   Manual therapy comments Manual therapy completed prior to exercises.   Myofascial Release Myofascial release and manual stretching completed to right upper arm, trapezius, and scapularis region to  decrease fascial restrictions and increase joint mobility in a pain free zone.                   OT Short Term Goals - 07/22/16 1505      OT SHORT TERM GOAL #1   Title Pt will be provided with and educated on HEP.    Time 4   Period Weeks   Status On-going     OT SHORT TERM GOAL #2   Title Pt will decrease pain to 2/10 or less in RUE during daily tasks.    Time 4   Period Weeks   Status Achieved     OT SHORT TERM GOAL #3   Title Pt will decrease fascial restrictions in RUE to trace amounts to improve mobility of RUE during overhead reaching tasks.    Time 4   Period Weeks   Status On-going     OT SHORT TERM GOAL #4   Title Pt will increase A/ROM of RUE to [facility] to improve ability to reach into overhead cabinets or shelves during daily tasks.    Time 4   Period Weeks   Status Achieved     OT SHORT TERM GOAL #5   Title Pt will increase RUE strength to 4+/5 to improve ability to lift weighted items at shoulder level or above using RUE as dominant.    Time 4   Period Weeks   Status On-going                  Plan - 08/02/16 1555    Clinical Impression Statement A: Pt reports soreness beginning on Mon night, unsure of origin, reports improved after manual stretching. Added green theraband strengthening exercises this session, verbal cuing for form and technique, added wall push ups and cybex row and press as well, pt reports no increased pain at end of session.    Plan P: MFR prn, continue with strengthening, update HEP for theraband strengthening when appropriate   OT Home Exercise Plan 10/11: shoulder stretches; 10/18: A/ROM; 11/1: scapular theraband      Patient will benefit from skilled therapeutic intervention in order to improve the following deficits and impairments:  Impaired flexibility, Decreased strength, Pain, Decreased range of motion, Increased fascial restricitons, Impaired UE functional use  Visit Diagnosis: Stiffness of right shoulder, not  elsewhere classified  Other symptoms and signs involving the musculoskeletal system  Acute pain of right shoulder    Problem List Patient Active Problem List   Diagnosis Date Noted  . Coronary artery disease involving native coronary artery of native heart without angina pectoris 05/05/2016  . DM 02/11/2009  . HYPERLIPIDEMIA 02/11/2009  . HTN (hypertension) 02/11/2009  . CAD 02/11/2009   Guadelupe Sabin, OTR/L  [phone] 08/02/2016, 4:02 PM  [facility]  [place], [place], [address] Phone: [phone]   Fax:  [phone]  Name: ALDREN BOWAR MRN: [id number] Date of Birth: [birth year]

## 2016-08-04 ENCOUNTER — Encounter (HOSPITAL_COMMUNITY): Payer: Self-pay | Admitting: Occupational Therapy

## 2016-08-04 ENCOUNTER — Ambulatory Visit (HOSPITAL_COMMUNITY): Payer: 59 | Admitting: Occupational Therapy

## 2016-08-04 DIAGNOSIS — M25611 Stiffness of right shoulder, not elsewhere classified: Secondary | ICD-10-CM | POA: Diagnosis not present

## 2016-08-04 DIAGNOSIS — M25511 Pain in right shoulder: Secondary | ICD-10-CM

## 2016-08-04 DIAGNOSIS — R29898 Other symptoms and signs involving the musculoskeletal system: Secondary | ICD-10-CM

## 2016-08-04 NOTE — Therapy (Signed)
[place], [place], [address] Phone: [phone]   Fax:  [phone]  Occupational Therapy Treatment  Patient Details  Name: Jonathan Walsh MRN: [id number] Date of Birth: 02/18/1956 Referring Provider: Dr. Justice Britain  Encounter Date: 08/04/2016      OT End of Session - 08/04/16 1150    Visit Number 12   Number of Visits 16   Date for OT Re-Evaluation 08/26/16   Authorization Type UHC   Authorization Time Period No visit limit: after 30 visits contact insurance company    Authorization - Visit Number 12   Authorization - Number of Visits 30   OT Start Time 1110   OT Stop Time 1152   OT Time Calculation (min) 42 min   Activity Tolerance Patient tolerated treatment well   Behavior During Therapy [facility] for tasks assessed/performed      Past Medical History:  Diagnosis Date  . Coronary artery disease    Angioplasty of OM 2002  . Diverticulosis   . Essential hypertension   . History of colonic polyps    Colonoscopy 2011  . History of non-ST elevation myocardial infarction (NSTEMI)    2002  . Hyperlipidemia   . Nephrolithiasis   . Type 2 diabetes mellitus (HCC)     No past surgical history on file.  There were no vitals filed for this visit.      Subjective Assessment - 08/04/16 1111    Subjective  S: It feels better than it did on Tuesday.    Currently in Pain? No/denies            [facility] OT Assessment - 08/04/16 1111      Assessment   Diagnosis Right shoulder adhesive capsulitis; complete right rotator cuff tear     Precautions   Precautions None                  OT Treatments/Exercises (OP) - 08/04/16 1111      Exercises   Exercises Shoulder     Shoulder Exercises: Supine   Protraction PROM;5 reps;Strengthening;15 reps   Protraction Weight (lbs) 1   Horizontal ABduction PROM;5 reps;Strengthening;15 reps   Horizontal ABduction Weight (lbs) 1   External Rotation  PROM;5 reps;Strengthening;15 reps   External Rotation Weight (lbs) 1   External Rotation Limitations shoulders abducted   Internal Rotation PROM;5 reps;Strengthening;15 reps   Internal Rotation Weight (lbs) 1   Internal Rotation Limitations shoulders abducted   Flexion PROM;5 reps;Strengthening;15 reps   Shoulder Flexion Weight (lbs) 1   ABduction PROM;5 reps;Strengthening;15 reps   Shoulder ABduction Weight (lbs) 1     Shoulder Exercises: Sidelying   External Rotation Strengthening;12 reps   External Rotation Weight (lbs) 1   Internal Rotation Strengthening;12 reps   Internal Rotation Weight (lbs) 1   Flexion Strengthening;12 reps   Flexion Weight (lbs) 1   ABduction Strengthening;12 reps   ABduction Weight (lbs) 1   Other Sidelying Exercises protraction, 12X, 1# weight   Other Sidelying Exercises horizontal abduction, 12X, 1# weight     Shoulder Exercises: Standing   Protraction Theraband;12 reps   Theraband Level (Shoulder Protraction) Level 3 (Green)   Horizontal ABduction Theraband;12 reps   Theraband Level (Shoulder Horizontal ABduction) Level 3 (Green)   External Rotation Theraband;12 reps   Theraband Level (Shoulder External Rotation) Level 3 (Green)   Internal Rotation Theraband;12 reps   Theraband Level (Shoulder Internal Rotation) Level 3 (Green)   Flexion Theraband;12 reps  Theraband Level (Shoulder Flexion) Level 3 (Green)   ABduction Theraband;12 reps   Theraband Level (Shoulder ABduction) Level 3 (Green)     Shoulder Exercises: ROM/Strengthening   UBE (Upper Arm Bike) Level 2 3' forward 3' reverse   Cybex Press 2 plate;15 reps   Cybex Row 2 plate;15 reps   Over Head Lace 1' with 1# weight     Manual Therapy   Manual Therapy Myofascial release;Muscle Energy Technique   Manual therapy comments Manual therapy completed prior to exercises.   Myofascial Release Myofascial release and manual stretching completed to right upper arm, trapezius, and scapularis  region to decrease fascial restrictions and increase joint mobility in a pain free zone.                   OT Short Term Goals - 07/22/16 1505      OT SHORT TERM GOAL #1   Title Pt will be provided with and educated on HEP.    Time 4   Period Weeks   Status On-going     OT SHORT TERM GOAL #2   Title Pt will decrease pain to 2/10 or less in RUE during daily tasks.    Time 4   Period Weeks   Status Achieved     OT SHORT TERM GOAL #3   Title Pt will decrease fascial restrictions in RUE to trace amounts to improve mobility of RUE during overhead reaching tasks.    Time 4   Period Weeks   Status On-going     OT SHORT TERM GOAL #4   Title Pt will increase A/ROM of RUE to [facility] to improve ability to reach into overhead cabinets or shelves during daily tasks.    Time 4   Period Weeks   Status Achieved     OT SHORT TERM GOAL #5   Title Pt will increase RUE strength to 4+/5 to improve ability to lift weighted items at shoulder level or above using RUE as dominant.    Time 4   Period Weeks   Status On-going                  Plan - 08/04/16 1148    Clinical Impression Statement A: Pt reports decreased soreness this session. Continued with strengthening resuming sidelying and completing standing theraband strengthening exercises. Increased supine exercises and cybex row/press to 15 repetitions. Pt required intermittent verbal cuing for correct form during theraband exercises.    Plan P: MFR prn, update HEP for green theraband strengthening   OT Home Exercise Plan 10/11: shoulder stretches; 10/18: A/ROM; 11/1: scapular theraband      Patient will benefit from skilled therapeutic intervention in order to improve the following deficits and impairments:  Impaired flexibility, Decreased strength, Pain, Decreased range of motion, Increased fascial restricitons, Impaired UE functional use  Visit Diagnosis: Stiffness of right shoulder, not elsewhere classified  Other  symptoms and signs involving the musculoskeletal system  Acute pain of right shoulder    Problem List Patient Active Problem List   Diagnosis Date Noted  . Coronary artery disease involving native coronary artery of native heart without angina pectoris 05/05/2016  . DM 02/11/2009  . HYPERLIPIDEMIA 02/11/2009  . HTN (hypertension) 02/11/2009  . CAD 02/11/2009   Guadelupe Sabin, OTR/L  [phone] 08/04/2016, 11:54 AM  [place] [address] [place], [place], [address] Phone: [phone]   Fax:  [phone]  Name: RUTGER FEEST MRN: [id number] Date of  Birth: 07/27/1956

## 2016-08-09 ENCOUNTER — Encounter (HOSPITAL_COMMUNITY): Payer: Self-pay | Admitting: Occupational Therapy

## 2016-08-09 ENCOUNTER — Ambulatory Visit (HOSPITAL_COMMUNITY): Payer: 59 | Admitting: Occupational Therapy

## 2016-08-09 DIAGNOSIS — M25611 Stiffness of right shoulder, not elsewhere classified: Secondary | ICD-10-CM | POA: Diagnosis not present

## 2016-08-09 DIAGNOSIS — R29898 Other symptoms and signs involving the musculoskeletal system: Secondary | ICD-10-CM

## 2016-08-09 NOTE — Therapy (Signed)
[place], [place], [address] Phone: [phone]   Fax:  [phone]  Occupational Therapy Treatment  Patient Details  Name: Jonathan Walsh MRN: [id number] Date of Birth: November 02, 1955 Referring Provider: Dr. Justice Britain  Encounter Date: 08/09/2016      OT End of Session - 08/09/16 1412    Visit Number 13   Number of Visits 16   Date for OT Re-Evaluation 08/26/16   Authorization Type UHC   Authorization Time Period No visit limit: after 30 visits contact insurance company    Authorization - Visit Number 13   Authorization - Number of Visits 30   OT Start Time 1302   OT Stop Time 1342   OT Time Calculation (min) 40 min   Activity Tolerance Patient tolerated treatment well   Behavior During Therapy [facility] for tasks assessed/performed      Past Medical History:  Diagnosis Date  . Coronary artery disease    Angioplasty of OM 2002  . Diverticulosis   . Essential hypertension   . History of colonic polyps    Colonoscopy 2011  . History of non-ST elevation myocardial infarction (NSTEMI)    2002  . Hyperlipidemia   . Nephrolithiasis   . Type 2 diabetes mellitus (HCC)     No past surgical history on file.  There were no vitals filed for this visit.      Subjective Assessment - 08/09/16 1302    Subjective  S: My shoulder did a lot better over the weekend.    Currently in Pain? No/denies            [facility] OT Assessment - 08/09/16 1301      Assessment   Diagnosis Right shoulder adhesive capsulitis; complete right rotator cuff tear     Precautions   Precautions None                  OT Treatments/Exercises (OP) - 08/09/16 1304      Exercises   Exercises Shoulder     Shoulder Exercises: Supine   Protraction PROM;5 reps;Strengthening;15 reps   Protraction Weight (lbs) 1   Horizontal ABduction PROM;5 reps;Strengthening;15 reps   Horizontal ABduction Weight (lbs) 1   External  Rotation PROM;5 reps;Strengthening;15 reps   External Rotation Weight (lbs) 1   External Rotation Limitations shoulders abducted   Internal Rotation PROM;5 reps;Strengthening;15 reps   Internal Rotation Weight (lbs) 1   Internal Rotation Limitations shoulders abducted   Flexion PROM;5 reps;Strengthening;15 reps   Shoulder Flexion Weight (lbs) 1   ABduction PROM;5 reps;Strengthening;15 reps   Shoulder ABduction Weight (lbs) 1     Shoulder Exercises: Standing   Protraction Strengthening;15 reps   Protraction Weight (lbs) 1   Horizontal ABduction Strengthening;15 reps   Horizontal ABduction Weight (lbs) 1   External Rotation Strengthening;15 reps   External Rotation Weight (lbs) 1   External Rotation Limitations abducted   Internal Rotation Strengthening;15 reps   Internal Rotation Weight (lbs) 1   Internal Rotation Limitations abducted   Flexion Strengthening;15 reps   Shoulder Flexion Weight (lbs) 1   ABduction Strengthening;15 reps   Shoulder ABduction Weight (lbs) 1   Extension Theraband;15 reps   Theraband Level (Shoulder Extension) Level 3 (Green)   Row [employer] reps   Theraband Level (Shoulder Row) Level 3 (Green)   Retraction Theraband;15 reps   Theraband Level (Shoulder Retraction) Level 3 (Green)     Shoulder Exercises: ROM/Strengthening   Cybex Press 2.5 plate  Cybex Row 2.5 plate;15 reps   "W" Arms 12X, 1# weight   X to V Arms 15X, 1# weight   Proximal Shoulder Strengthening, Supine 15X each with 1# weight, no rest breaks   Proximal Shoulder Strengthening, Seated 15X each, 1# weight no rest breaks   Ball on Wall 1' flexion 1' abduction   Other ROM/Strengthening Exercises standing field goal, 12X 1# weight     Shoulder Exercises: Stretch   Internal Rotation Stretch 2 reps  20 seconds   External Rotation Stretch 2 reps;20 seconds     Manual Therapy   Manual Therapy Myofascial release;Muscle Energy Technique   Manual therapy comments Manual therapy  completed prior to exercises.   Myofascial Release Myofascial release and manual stretching completed to right upper arm, trapezius, and scapularis region to decrease fascial restrictions and increase joint mobility in a pain free zone.                   OT Short Term Goals - 07/22/16 1505      OT SHORT TERM GOAL #1   Title Pt will be provided with and educated on HEP.    Time 4   Period Weeks   Status On-going     OT SHORT TERM GOAL #2   Title Pt will decrease pain to 2/10 or less in RUE during daily tasks.    Time 4   Period Weeks   Status Achieved     OT SHORT TERM GOAL #3   Title Pt will decrease fascial restrictions in RUE to trace amounts to improve mobility of RUE during overhead reaching tasks.    Time 4   Period Weeks   Status On-going     OT SHORT TERM GOAL #4   Title Pt will increase A/ROM of RUE to [facility] to improve ability to reach into overhead cabinets or shelves during daily tasks.    Time 4   Period Weeks   Status Achieved     OT SHORT TERM GOAL #5   Title Pt will increase RUE strength to 4+/5 to improve ability to lift weighted items at shoulder level or above using RUE as dominant.    Time 4   Period Weeks   Status On-going                  Plan - 08/09/16 1412    Clinical Impression Statement A: Continued strengthening this date, resumed standing exercises with weights, increased cybex row/press to 2.5#, added weight to all standing exercises. Pt reports pain when he sleeps on the right shoulder, however otherwise is not exeriencing any pain. Intermittent verbal cuing for form.    Plan P: MFR prn, update HEP for green theraband strengthening, continue with weights   OT Home Exercise Plan 10/11: shoulder stretches; 10/18: A/ROM; 11/1: scapular theraband      Patient will benefit from skilled therapeutic intervention in order to improve the following deficits and impairments:  Impaired flexibility, Decreased strength, Pain, Decreased  range of motion, Increased fascial restricitons, Impaired UE functional use  Visit Diagnosis: Stiffness of right shoulder, not elsewhere classified  Other symptoms and signs involving the musculoskeletal system    Problem List Patient Active Problem List   Diagnosis Date Noted  . Coronary artery disease involving native coronary artery of native heart without angina pectoris 05/05/2016  . DM 02/11/2009  . HYPERLIPIDEMIA 02/11/2009  . HTN (hypertension) 02/11/2009  . CAD 02/11/2009   Guadelupe Sabin, OTR/L  [phone] 08/09/2016, 2:15  PM  [place], [place], [address] Phone: [phone]   Fax:  [phone]  Name: VIOLA MICHALCZYK MRN: [id number] Date of Birth: Jan 01, 1956

## 2016-08-10 ENCOUNTER — Ambulatory Visit (HOSPITAL_COMMUNITY): Payer: 59 | Admitting: Occupational Therapy

## 2016-08-10 ENCOUNTER — Encounter (HOSPITAL_COMMUNITY): Payer: Self-pay | Admitting: Occupational Therapy

## 2016-08-10 DIAGNOSIS — M25611 Stiffness of right shoulder, not elsewhere classified: Secondary | ICD-10-CM

## 2016-08-10 DIAGNOSIS — M25511 Pain in right shoulder: Secondary | ICD-10-CM

## 2016-08-10 DIAGNOSIS — R29898 Other symptoms and signs involving the musculoskeletal system: Secondary | ICD-10-CM

## 2016-08-10 NOTE — Therapy (Signed)
[place], [place], [address] Phone: [phone]   Fax:  [phone]  Occupational Therapy Treatment  Patient Details  Name: Jonathan Walsh MRN: [id number] Date of Birth: 11-24-1955 Referring Provider: Dr. Justice Britain  Encounter Date: 08/10/2016      OT End of Session - 08/10/16 1343    Visit Number 14   Number of Visits 16   Date for OT Re-Evaluation 08/26/16   Authorization Type UHC   Authorization Time Period No visit limit: after 30 visits contact insurance company    Authorization - Visit Number 14   Authorization - Number of Visits 30   OT Start Time 1300   OT Stop Time 1346   OT Time Calculation (min) 46 min   Activity Tolerance Patient tolerated treatment well   Behavior During Therapy [facility] for tasks assessed/performed      Past Medical History:  Diagnosis Date  . Coronary artery disease    Angioplasty of OM 2002  . Diverticulosis   . Essential hypertension   . History of colonic polyps    Colonoscopy 2011  . History of non-ST elevation myocardial infarction (NSTEMI)    2002  . Hyperlipidemia   . Nephrolithiasis   . Type 2 diabetes mellitus (HCC)     No past surgical history on file.  There were no vitals filed for this visit.      Subjective Assessment - 08/10/16 1300    Subjective  S: My shoulder was not as sore as I thought it was going to be yesterday afternoon.    Currently in Pain? No/denies            [facility] OT Assessment - 08/10/16 1300      Assessment   Diagnosis Right shoulder adhesive capsulitis; complete right rotator cuff tear     Precautions   Precautions None                  OT Treatments/Exercises (OP) - 08/10/16 1301      Exercises   Exercises Shoulder     Shoulder Exercises: Supine   Protraction PROM;5 reps;Strengthening;10 reps   Protraction Weight (lbs) 2   Horizontal ABduction PROM;5 reps;Strengthening;10 reps   Horizontal ABduction  Weight (lbs) 2   External Rotation PROM;5 reps;Strengthening;10 reps   External Rotation Weight (lbs) 2   Internal Rotation PROM;5 reps;Strengthening;10 reps   Internal Rotation Weight (lbs) 2   Flexion PROM;5 reps;Strengthening;10 reps   Shoulder Flexion Weight (lbs) 2   ABduction PROM;5 reps;Strengthening;10 reps   Shoulder ABduction Weight (lbs) 2     Shoulder Exercises: Standing   Protraction Theraband;15 reps   Theraband Level (Shoulder Protraction) Level 3 (Green)   Horizontal ABduction Theraband;15 reps   Theraband Level (Shoulder Horizontal ABduction) Level 3 (Green)   External Rotation Theraband;15 reps   Theraband Level (Shoulder External Rotation) Level 3 (Green)   Internal Rotation Theraband;15 reps   Theraband Level (Shoulder Internal Rotation) Level 3 (Green)   Flexion Theraband;15 reps   Theraband Level (Shoulder Flexion) Level 3 (Green)   ABduction Theraband;15 reps   Theraband Level (Shoulder ABduction) Level 3 (Green)     Shoulder Exercises: ROM/Strengthening   UBE (Upper Arm Bike) Level 2 3' forward 3' reverse   Cybex Press 2.5 plate;15 reps   Cybex Row 2.5 plate;15 reps   "W" Arms 12X, 1# weight   X to V Arms 15X, 1# weight   Proximal Shoulder Strengthening, Supine 10X each with  2# weight, no rest breaks   Ball on Wall 1'30" flexion 1'30' abduction   Other ROM/Strengthening Exercises standing field goal, 15X 1# weight     Manual Therapy   Manual Therapy Myofascial release;Muscle Energy Technique   Manual therapy comments Manual therapy completed prior to exercises.   Myofascial Release Myofascial release and manual stretching completed to right upper arm, trapezius, and scapularis region to decrease fascial restrictions and increase joint mobility in a pain free zone.                 OT Education - 08/10/16 1341    Education provided Yes   Education Details green theraband strengthening exercises   Person(s) Educated Patient   Methods  Explanation;Demonstration;Handout   Comprehension Verbalized understanding;Returned demonstration          OT Short Term Goals - 07/22/16 1505      OT SHORT TERM GOAL #1   Title Pt will be provided with and educated on HEP.    Time 4   Period Weeks   Status On-going     OT SHORT TERM GOAL #2   Title Pt will decrease pain to 2/10 or less in RUE during daily tasks.    Time 4   Period Weeks   Status Achieved     OT SHORT TERM GOAL #3   Title Pt will decrease fascial restrictions in RUE to trace amounts to improve mobility of RUE during overhead reaching tasks.    Time 4   Period Weeks   Status On-going     OT SHORT TERM GOAL #4   Title Pt will increase A/ROM of RUE to [facility] to improve ability to reach into overhead cabinets or shelves during daily tasks.    Time 4   Period Weeks   Status Achieved     OT SHORT TERM GOAL #5   Title Pt will increase RUE strength to 4+/5 to improve ability to lift weighted items at shoulder level or above using RUE as dominant.    Time 4   Period Weeks   Status On-going                  Plan - 08/10/16 1341    Clinical Impression Statement A: Continued strengthening exercises, increased weight to 2# in supine, resumed green theraband strengthening. Increased ball on wall to 1'30" each direction. Updated HEP for green therabnd strengthening this session. Pt reports minimal pain from yesterday.    Plan P: MFR prn, follow up on updated HEP. Follow up on soreness, increase weight to 2# in sidelying if pt able to tolerate.    OT Home Exercise Plan 10/11: shoulder stretches; 10/18: A/ROM; 11/1: scapular theraband; 11/22: green theraband strengthening   Consulted and Agree with Plan of Care Patient      Patient will benefit from skilled therapeutic intervention in order to improve the following deficits and impairments:  Impaired flexibility, Decreased strength, Pain, Decreased range of motion, Increased fascial restricitons, Impaired UE  functional use  Visit Diagnosis: Stiffness of right shoulder, not elsewhere classified  Other symptoms and signs involving the musculoskeletal system  Acute pain of right shoulder    Problem List Patient Active Problem List   Diagnosis Date Noted  . Coronary artery disease involving native coronary artery of native heart without angina pectoris 05/05/2016  . DM 02/11/2009  . HYPERLIPIDEMIA 02/11/2009  . HTN (hypertension) 02/11/2009  . CAD 02/11/2009   Guadelupe Sabin, OTR/L  [phone] 08/10/2016, 1:47 PM  [place], [place], [address] Phone: [phone]   Fax:  [phone]  Name: Jonathan Walsh MRN: [id number] Date of Birth: 25-Apr-1956

## 2016-08-10 NOTE — Patient Instructions (Signed)
Theraband strengthening: Complete 10-15X, 1-2X/day  1) Shoulder protraction  Anchor band in doorway, stand with back to door. Push your hand forward as much as you can to bringing your shoulder blades forward on your rib cage.     2) Shoulder flexion  While standing with back to the door, holding Theraband at hand level, raise arm in front of you.  Keep elbow straight through entire movement.      3) Shoulder horizontal abduction  Standing with a theraband anchored at chest height, begin with arm straight and some tension in the band. Move your arm out to your side (keeping straight the whole time). Bring the affected arm back to midline.     4) Shoulder Internal Rotation  While holding an elastic band at your side with your elbow bent, start with your hand away from your stomach, then pull the band towards your stomach. Keep your elbow near your side the entire time.     5) Shoulder External Rotation  While holding an elastic band at your side with your elbow bent, start with your hand near your stomach and then pull the band away. Keep your elbow at your side the entire time.     6) Shoulder abduction  While holding an elastic band at your side, draw up your arm to the side keeping your elbow straight.   

## 2016-08-16 ENCOUNTER — Ambulatory Visit (HOSPITAL_COMMUNITY): Payer: 59 | Admitting: Occupational Therapy

## 2016-08-17 ENCOUNTER — Ambulatory Visit (HOSPITAL_COMMUNITY): Payer: 59 | Admitting: Occupational Therapy

## 2016-08-17 ENCOUNTER — Encounter (HOSPITAL_COMMUNITY): Payer: Self-pay | Admitting: Occupational Therapy

## 2016-08-17 DIAGNOSIS — M25511 Pain in right shoulder: Secondary | ICD-10-CM

## 2016-08-17 DIAGNOSIS — R29898 Other symptoms and signs involving the musculoskeletal system: Secondary | ICD-10-CM

## 2016-08-17 DIAGNOSIS — M25611 Stiffness of right shoulder, not elsewhere classified: Secondary | ICD-10-CM | POA: Diagnosis not present

## 2016-08-17 NOTE — Therapy (Signed)
[place], [place], [address] Phone: [phone]   Fax:  [phone]  Occupational Therapy Treatment  Patient Details  Name: Jonathan Walsh MRN: [id number] Date of Birth: 1956/06/19 Referring Provider: Dr. Justice Britain  Encounter Date: 08/17/2016      OT End of Session - 08/17/16 1519    Visit Number 15   Number of Visits 16   Date for OT Re-Evaluation 08/26/16   Authorization Type UHC   Authorization Time Period No visit limit: after 30 visits contact insurance company    Authorization - Visit Number 15   Authorization - Number of Visits 30   OT Start Time 1345   OT Stop Time 1428   OT Time Calculation (min) 43 min   Activity Tolerance Patient tolerated treatment well   Behavior During Therapy [facility] for tasks assessed/performed      Past Medical History:  Diagnosis Date  . Coronary artery disease    Angioplasty of OM 2002  . Diverticulosis   . Essential hypertension   . History of colonic polyps    Colonoscopy 2011  . History of non-ST elevation myocardial infarction (NSTEMI)    2002  . Hyperlipidemia   . Nephrolithiasis   . Type 2 diabetes mellitus (HCC)     No past surgical history on file.  There were no vitals filed for this visit.      Subjective Assessment - 08/17/16 1344    Subjective  S: I was able to blow the leaves out of my gutters yesterday.    Currently in Pain? No/denies            [facility] OT Assessment - 08/17/16 1344      Assessment   Diagnosis Right shoulder adhesive capsulitis; complete right rotator cuff tear     Precautions   Precautions None                  OT Treatments/Exercises (OP) - 08/17/16 1348      Exercises   Exercises Shoulder     Shoulder Exercises: Supine   Protraction PROM;5 reps;Strengthening;10 reps   Protraction Weight (lbs) 2   Horizontal ABduction PROM;5 reps;Strengthening;10 reps   Horizontal ABduction Weight (lbs) 2    External Rotation PROM;5 reps;Strengthening;10 reps   External Rotation Weight (lbs) 2   Internal Rotation PROM;5 reps;Strengthening;10 reps   Internal Rotation Weight (lbs) 2   Flexion PROM;5 reps;Strengthening;10 reps   Shoulder Flexion Weight (lbs) 2   ABduction PROM;5 reps;Strengthening;10 reps   Shoulder ABduction Weight (lbs) 2     Shoulder Exercises: Sidelying   External Rotation Strengthening;10 reps   External Rotation Weight (lbs) 2   Internal Rotation Strengthening;10 reps   Internal Rotation Weight (lbs) 2   Flexion Strengthening;10 reps   Flexion Weight (lbs) 2   ABduction Strengthening;10 reps   ABduction Weight (lbs) 2   Other Sidelying Exercises protraction, 10X, 2# weight   Other Sidelying Exercises horizontal abduction, 10X, 2# weight     Shoulder Exercises: ROM/Strengthening   UBE (Upper Arm Bike) Level 2 3' forward 3' reverse   Wall Pushups 15 reps   X to V Arms 10X, 2# weight   Proximal Shoulder Strengthening, Supine 10X each with 2# weight, no rest breaks   Proximal Shoulder Strengthening, Seated 10X each, 2# weight no rest breaks   Ball on Wall 2' flexion 2' abduction   Other ROM/Strengthening Exercises arms on fire, 4 positions, 15 seconds each, 1' total  Manual Therapy   Manual Therapy Myofascial release;Muscle Energy Technique   Manual therapy comments Manual therapy completed prior to exercises.   Myofascial Release Myofascial release and manual stretching completed to right upper arm, trapezius, and scapularis region to decrease fascial restrictions and increase joint mobility in a pain free zone.                   OT Short Term Goals - 07/22/16 1505      OT SHORT TERM GOAL #1   Title Pt will be provided with and educated on HEP.    Time 4   Period Weeks   Status On-going     OT SHORT TERM GOAL #2   Title Pt will decrease pain to 2/10 or less in RUE during daily tasks.    Time 4   Period Weeks   Status Achieved     OT SHORT  TERM GOAL #3   Title Pt will decrease fascial restrictions in RUE to trace amounts to improve mobility of RUE during overhead reaching tasks.    Time 4   Period Weeks   Status On-going     OT SHORT TERM GOAL #4   Title Pt will increase A/ROM of RUE to [facility] to improve ability to reach into overhead cabinets or shelves during daily tasks.    Time 4   Period Weeks   Status Achieved     OT SHORT TERM GOAL #5   Title Pt will increase RUE strength to 4+/5 to improve ability to lift weighted items at shoulder level or above using RUE as dominant.    Time 4   Period Weeks   Status On-going                  Plan - 08/17/16 1519    Clinical Impression Statement A: Pt continues to make excellent progress with strengthening exercises. Progress to 2# weights in sidelying and standing exercises, increased ball on wall to 2' each direction, min difficulty. Pt required intermittent verbal cuing for form during exercises.    Plan P: MFR prn, continue with strengthening using 2# weights, progress activity tolerance   OT Home Exercise Plan 10/11: shoulder stretches; 10/18: A/ROM; 11/1: scapular theraband; 11/22: green theraband strengthening   Consulted and Agree with Plan of Care Patient      Patient will benefit from skilled therapeutic intervention in order to improve the following deficits and impairments:  Impaired flexibility, Decreased strength, Pain, Decreased range of motion, Increased fascial restricitons, Impaired UE functional use  Visit Diagnosis: Stiffness of right shoulder, not elsewhere classified  Other symptoms and signs involving the musculoskeletal system  Acute pain of right shoulder    Problem List Patient Active Problem List   Diagnosis Date Noted  . Coronary artery disease involving native coronary artery of native heart without angina pectoris 05/05/2016  . DM 02/11/2009  . HYPERLIPIDEMIA 02/11/2009  . HTN (hypertension) 02/11/2009  . CAD 02/11/2009    Guadelupe Sabin, OTR/L  [phone] 08/17/2016, 3:21 PM  [place] [address] [place], [place], [address] Phone: [phone]   Fax:  [phone]  Name: Jonathan Walsh MRN: [id number] Date of Birth: 1955-10-14

## 2016-08-19 ENCOUNTER — Ambulatory Visit (HOSPITAL_COMMUNITY): Payer: 59 | Attending: Orthopedic Surgery | Admitting: Occupational Therapy

## 2016-08-19 ENCOUNTER — Encounter (HOSPITAL_COMMUNITY): Payer: Self-pay | Admitting: Occupational Therapy

## 2016-08-19 DIAGNOSIS — M25611 Stiffness of right shoulder, not elsewhere classified: Secondary | ICD-10-CM | POA: Diagnosis not present

## 2016-08-19 DIAGNOSIS — M25511 Pain in right shoulder: Secondary | ICD-10-CM | POA: Insufficient documentation

## 2016-08-19 DIAGNOSIS — R29898 Other symptoms and signs involving the musculoskeletal system: Secondary | ICD-10-CM | POA: Diagnosis present

## 2016-08-19 NOTE — Therapy (Signed)
[place], [place], [address] Phone: [phone]   Fax:  [phone]  Occupational Therapy Treatment  Patient Details  Name: Jonathan Walsh MRN: [id number] Date of Birth: 08/11/1956 Referring Provider: Dr. Justice Britain  Encounter Date: 08/19/2016      OT End of Session - 08/19/16 1344    Visit Number 16   Number of Visits 18   Date for OT Re-Evaluation 08/26/16   Authorization Type UHC   Authorization Time Period No visit limit: after 30 visits contact insurance company    Authorization - Visit Number 16   Authorization - Number of Visits 30   OT Start Time 1300   OT Stop Time 1343   OT Time Calculation (min) 43 min   Activity Tolerance Patient tolerated treatment well   Behavior During Therapy [facility] for tasks assessed/performed      Past Medical History:  Diagnosis Date  . Coronary artery disease    Angioplasty of OM 2002  . Diverticulosis   . Essential hypertension   . History of colonic polyps    Colonoscopy 2011  . History of non-ST elevation myocardial infarction (NSTEMI)    2002  . Hyperlipidemia   . Nephrolithiasis   . Type 2 diabetes mellitus (HCC)     No past surgical history on file.  There were no vitals filed for this visit.      Subjective Assessment - 08/19/16 1302    Subjective  S: It's actually been feeling pretty normal the last couple of days.    Currently in Pain? No/denies            [facility] OT Assessment - 08/19/16 1301      Assessment   Diagnosis Right shoulder adhesive capsulitis; complete right rotator cuff tear     Precautions   Precautions None                  OT Treatments/Exercises (OP) - 08/19/16 1302      Exercises   Exercises Shoulder     Shoulder Exercises: Supine   Protraction PROM;5 reps;Strengthening;12 reps   Protraction Weight (lbs) 2   Horizontal ABduction PROM;5 reps;Strengthening;12 reps   Horizontal ABduction Weight (lbs) 2    External Rotation PROM;5 reps;Strengthening;12 reps   External Rotation Weight (lbs) 2   Internal Rotation PROM;5 reps;Strengthening;12 reps   Internal Rotation Weight (lbs) 2   Flexion PROM;5 reps;Strengthening;12 reps   Shoulder Flexion Weight (lbs) 2   ABduction PROM;5 reps;Strengthening;12 reps   Shoulder ABduction Weight (lbs) 2     Shoulder Exercises: Sidelying   External Rotation Strengthening;12 reps   External Rotation Weight (lbs) 2   Internal Rotation Strengthening;12 reps   Internal Rotation Weight (lbs) 2   Flexion Strengthening;12 reps   Flexion Weight (lbs) 2   ABduction Strengthening;12 reps   ABduction Weight (lbs) 2   Other Sidelying Exercises protraction, 12X, 2# weight   Other Sidelying Exercises horizontal abduction, 12X, 2# weight     Shoulder Exercises: Standing   Protraction Strengthening;10 reps   Protraction Weight (lbs) 2   Horizontal ABduction Strengthening;10 reps   Horizontal ABduction Weight (lbs) 2   External Rotation Strengthening;10 reps   External Rotation Weight (lbs) 2   Internal Rotation Strengthening;10 reps   Internal Rotation Weight (lbs) 2   Flexion Strengthening;10 reps   Shoulder Flexion Weight (lbs) 2   ABduction Strengthening;10 reps   Shoulder ABduction Weight (lbs) 2     Shoulder  Exercises: ROM/Strengthening   UBE (Upper Arm Bike) Level 2 3' forward 3' reverse   Cybex Press 2.5 plate;15 reps   Cybex Row 2.5 plate;15 reps   Wall Pushups 15 reps   "W" Arms 12X, 2# weight   X to V Arms 10X, 2# weight   Proximal Shoulder Strengthening, Supine 12X each with 2# weight, no rest breaks   Proximal Shoulder Strengthening, Seated 10X each, 2# weight no rest breaks   Ball on Wall 2' flexion 2' abduction   Other ROM/Strengthening Exercises standing field goal, 10X 2# weight   Other ROM/Strengthening Exercises arms on fire, 4 positions, 15 seconds each, 1' total                  OT Short Term Goals - 07/22/16 1505       OT SHORT TERM GOAL #1   Title Pt will be provided with and educated on HEP.    Time 4   Period Weeks   Status On-going     OT SHORT TERM GOAL #2   Title Pt will decrease pain to 2/10 or less in RUE during daily tasks.    Time 4   Period Weeks   Status Achieved     OT SHORT TERM GOAL #3   Title Pt will decrease fascial restrictions in RUE to trace amounts to improve mobility of RUE during overhead reaching tasks.    Time 4   Period Weeks   Status On-going     OT SHORT TERM GOAL #4   Title Pt will increase A/ROM of RUE to [facility] to improve ability to reach into overhead cabinets or shelves during daily tasks.    Time 4   Period Weeks   Status Achieved     OT SHORT TERM GOAL #5   Title Pt will increase RUE strength to 4+/5 to improve ability to lift weighted items at shoulder level or above using RUE as dominant.    Time 4   Period Weeks   Status On-going                  Plan - 08/19/16 1344    Clinical Impression Statement A: Session focused strictly on strengthening and activity tolerance this date, no manual therapy completed. Pt able to complete all exercises with minimal rest breaks, occasional verbal cuing for form. Pt reports min/mod fatigue at end of session, minimal tightness around scapula, no pain.    Plan P: Continue with strengthening, plan to discharge on 12/8.    OT Home Exercise Plan 10/11: shoulder stretches; 10/18: A/ROM; 11/1: scapular theraband; 11/22: green theraband strengthening   Consulted and Agree with Plan of Care Patient      Patient will benefit from skilled therapeutic intervention in order to improve the following deficits and impairments:  Impaired flexibility, Decreased strength, Pain, Decreased range of motion, Increased fascial restricitons, Impaired UE functional use  Visit Diagnosis: Stiffness of right shoulder, not elsewhere classified  Other symptoms and signs involving the musculoskeletal system  Acute pain of right  shoulder    Problem List Patient Active Problem List   Diagnosis Date Noted  . Coronary artery disease involving native coronary artery of native heart without angina pectoris 05/05/2016  . DM 02/11/2009  . HYPERLIPIDEMIA 02/11/2009  . HTN (hypertension) 02/11/2009  . CAD 02/11/2009   Guadelupe Sabin, OTR/L  [phone] 08/19/2016, 2:25 PM  [place], [place], [address] Phone: [phone]  Fax:  [phone]  Name: Jonathan Walsh MRN: [id number] Date of Birth: 05-16-1956

## 2016-08-23 ENCOUNTER — Ambulatory Visit (HOSPITAL_COMMUNITY): Payer: 59 | Admitting: Occupational Therapy

## 2016-08-23 ENCOUNTER — Encounter (HOSPITAL_COMMUNITY): Payer: 59 | Admitting: Occupational Therapy

## 2016-08-23 ENCOUNTER — Encounter (HOSPITAL_COMMUNITY): Payer: Self-pay | Admitting: Occupational Therapy

## 2016-08-23 DIAGNOSIS — M25511 Pain in right shoulder: Secondary | ICD-10-CM

## 2016-08-23 DIAGNOSIS — R29898 Other symptoms and signs involving the musculoskeletal system: Secondary | ICD-10-CM

## 2016-08-23 DIAGNOSIS — M25611 Stiffness of right shoulder, not elsewhere classified: Secondary | ICD-10-CM

## 2016-08-23 NOTE — Therapy (Signed)
[place], [place], [address] Phone: [phone]   Fax:  [phone]  Occupational Therapy Treatment  Patient Details  Name: Jonathan Walsh MRN: [id number] Date of Birth: [birth year] Referring Provider: Dr. Justice Britain  Encounter Date: 08/23/2016      OT End of Session - 08/23/16 1338    Visit Number 17   Number of Visits 18   Date for OT Re-Evaluation 08/26/16   Authorization Type UHC   Authorization Time Period No visit limit: after 30 visits contact insurance company    Authorization - Visit Number 17   Authorization - Number of Visits 30   OT Start Time 1300   OT Stop Time 1342   OT Time Calculation (min) 42 min   Activity Tolerance Patient tolerated treatment well   Behavior During Therapy [facility] for tasks assessed/performed      Past Medical History:  Diagnosis Date  . Coronary artery disease    Angioplasty of OM 2002  . Diverticulosis   . Essential hypertension   . History of colonic polyps    Colonoscopy 2011  . History of non-ST elevation myocardial infarction (NSTEMI)    2002  . Hyperlipidemia   . Nephrolithiasis   . Type 2 diabetes mellitus (HCC)     No past surgical history on file.  There were no vitals filed for this visit.      Subjective Assessment - 08/23/16 1259    Subjective  S: I worked some this weekend so it's a little sore today.    Currently in Pain? No/denies            [facility] OT Assessment - 08/23/16 1258      Assessment   Diagnosis Right shoulder adhesive capsulitis; complete right rotator cuff tear     Precautions   Precautions None                  OT Treatments/Exercises (OP) - 08/23/16 1301      Exercises   Exercises Shoulder     Shoulder Exercises: Supine   Protraction PROM;5 reps;Strengthening;12 reps   Protraction Weight (lbs) 2   Horizontal ABduction PROM;5 reps;Strengthening;12 reps   Horizontal ABduction Weight (lbs) 2   External Rotation PROM;5 reps;Strengthening;12 reps   External Rotation Weight (lbs) 2   Internal Rotation PROM;5 reps;Strengthening;12 reps   Internal Rotation Weight (lbs) 2   Flexion PROM;5 reps;Strengthening;12 reps   Shoulder Flexion Weight (lbs) 2   ABduction PROM;5 reps;Strengthening;12 reps   Shoulder ABduction Weight (lbs) 2     Shoulder Exercises: Standing   Protraction Strengthening;12 reps   Protraction Weight (lbs) 2   Horizontal ABduction Strengthening;12 reps   Horizontal ABduction Weight (lbs) 2   External Rotation Strengthening;12 reps;Theraband;15 reps   Theraband Level (Shoulder External Rotation) Level 3 (Green)   External Rotation Weight (lbs) 2   Internal Rotation Strengthening;12 reps;Theraband;15 reps   Theraband Level (Shoulder Internal Rotation) Level 3 (Green)   Internal Rotation Weight (lbs) 2   Flexion Strengthening;12 reps   Shoulder Flexion Weight (lbs) 2   ABduction Strengthening;12 reps   Shoulder ABduction Weight (lbs) 2   Extension Theraband;15 reps   Theraband Level (Shoulder Extension) Level 3 (Green)   Row [employer] reps   Theraband Level (Shoulder Row) Level 3 (Green)   Retraction Theraband;15 reps   Theraband Level (Shoulder Retraction) Level 3 (Green)     Shoulder Exercises: ROM/Strengthening   UBE (Upper Arm Bike) Level 3 3'  forward 3' reverse   Cybex Press 3 plate;15 reps   Cybex Row 3 plate;15 reps   Wall Pushups 20 reps   X to V Arms 12X, 2# weight   Proximal Shoulder Strengthening, Supine 12X each with 2# weight, no rest breaks   Proximal Shoulder Strengthening, Seated 12X each, 2# weight no rest breaks   Other ROM/Strengthening Exercises arms on fire, 4 positions, 30 seconds each, 2' total     Manual Therapy   Manual Therapy Myofascial release;Muscle Energy Technique   Manual therapy comments Manual therapy completed prior to exercises.   Myofascial Release Myofascial release and manual stretching completed to right upper  arm, trapezius, and scapularis region to decrease fascial restrictions and increase joint mobility in a pain free zone.                   OT Short Term Goals - 07/22/16 1505      OT SHORT TERM GOAL #1   Title Pt will be provided with and educated on HEP.    Time 4   Period Weeks   Status On-going     OT SHORT TERM GOAL #2   Title Pt will decrease pain to 2/10 or less in RUE during daily tasks.    Time 4   Period Weeks   Status Achieved     OT SHORT TERM GOAL #3   Title Pt will decrease fascial restrictions in RUE to trace amounts to improve mobility of RUE during overhead reaching tasks.    Time 4   Period Weeks   Status On-going     OT SHORT TERM GOAL #4   Title Pt will increase A/ROM of RUE to [facility] to improve ability to reach into overhead cabinets or shelves during daily tasks.    Time 4   Period Weeks   Status Achieved     OT SHORT TERM GOAL #5   Title Pt will increase RUE strength to 4+/5 to improve ability to lift weighted items at shoulder level or above using RUE as dominant.    Time 4   Period Weeks   Status On-going                  Plan - 08/23/16 1309    Clinical Impression Statement A: Manual therapy completed at beginning of session, trace tightness noted in right upper arm. Pt reports no increased pain after strengthening session last week, continued with strengthening and activity tolerance this session, pt using 2# weight for all exercises, increased cybex row/press to 3# plate, increased UBE to level 3. Pt required intermittent verbal cuing for form.    Plan P: Reassess and discharge pt with HEP.    OT Home Exercise Plan 10/11: shoulder stretches; 10/18: A/ROM; 11/1: scapular theraband; 11/22: green theraband strengthening      Patient will benefit from skilled therapeutic intervention in order to improve the following deficits and impairments:  Impaired flexibility, Decreased strength, Pain, Decreased range of motion, Increased  fascial restricitons, Impaired UE functional use  Visit Diagnosis: Stiffness of right shoulder, not elsewhere classified  Other symptoms and signs involving the musculoskeletal system  Acute pain of right shoulder    Problem List Patient Active Problem List   Diagnosis Date Noted  . Coronary artery disease involving native coronary artery of native heart without angina pectoris 05/05/2016  . DM 02/11/2009  . HYPERLIPIDEMIA 02/11/2009  . HTN (hypertension) 02/11/2009  . CAD 02/11/2009   Guadelupe Sabin, OTR/L  [phone]  08/23/2016, 1:42 PM  [place] [address] [place], [place], [address] Phone: [phone]   Fax:  [phone]  Name: Jonathan Walsh MRN: [id number] Date of Birth: March 25, 1956

## 2016-08-26 ENCOUNTER — Encounter (HOSPITAL_COMMUNITY): Payer: Self-pay | Admitting: Occupational Therapy

## 2016-08-26 ENCOUNTER — Ambulatory Visit (HOSPITAL_COMMUNITY): Payer: 59 | Admitting: Occupational Therapy

## 2016-08-26 DIAGNOSIS — R29898 Other symptoms and signs involving the musculoskeletal system: Secondary | ICD-10-CM

## 2016-08-26 DIAGNOSIS — M25611 Stiffness of right shoulder, not elsewhere classified: Secondary | ICD-10-CM | POA: Diagnosis not present

## 2016-08-26 DIAGNOSIS — M25511 Pain in right shoulder: Secondary | ICD-10-CM

## 2016-08-26 NOTE — Therapy (Signed)
[place], [place], [address] Phone: [phone]   Fax:  [phone]  Occupational Therapy Reassessment, Treatment, Discharge  Patient Details  Name: Jonathan Walsh MRN: [id number] Date of Birth: Aug 25, 1956 Referring Provider: Dr. Justice Britain  Encounter Date: 08/26/2016      OT End of Session - 08/26/16 1350    Visit Number 18   Number of Visits 18   Date for OT Re-Evaluation 08/26/16   Authorization Type UHC   Authorization Time Period No visit limit: after 30 visits contact insurance company    Authorization - Visit Number 18   Authorization - Number of Visits 30   OT Start Time 0962   OT Stop Time 1345   OT Time Calculation (min) 46 min   Activity Tolerance Patient tolerated treatment well   Behavior During Therapy [facility] for tasks assessed/performed      Past Medical History:  Diagnosis Date  . Coronary artery disease    Angioplasty of OM 2002  . Diverticulosis   . Essential hypertension   . History of colonic polyps    Colonoscopy 2011  . History of non-ST elevation myocardial infarction (NSTEMI)    2002  . Hyperlipidemia   . Nephrolithiasis   . Type 2 diabetes mellitus (HCC)     No past surgical history on file.  There were no vitals filed for this visit.      Subjective Assessment - 08/26/16 1306    Subjective  S: I aggravated it a little bit stretching to reach a light socket.    Currently in Pain? No/denies           [facility] OT Assessment - 08/26/16 1258      Assessment   Diagnosis Right shoulder adhesive capsulitis; complete right rotator cuff tear     Precautions   Precautions None     Palpation   Palpation comment Trace fascial restrictions in right upper arm and anterior deltoid regions     AROM   Overall AROM Comments Assessed seated, er/IR adducted   AROM Assessment Site Shoulder   Right/Left Shoulder Right   Right Shoulder Flexion 168 Degrees  140 previous   Right Shoulder ABduction 175 Degrees  153 previous   Right Shoulder Internal Rotation 90 Degrees  same as previous   Right Shoulder External Rotation 74 Degrees     PROM   Overall PROM Comments Assessed supine, er/IR adducted   PROM Assessment Site Shoulder   Right/Left Shoulder Right   Right Shoulder Flexion 170 Degrees  164 previous   Right Shoulder ABduction 180 Degrees  same as previous   Right Shoulder Internal Rotation 90 Degrees  same as previous   Right Shoulder External Rotation 80 Degrees  same as previous     Strength   Overall Strength Comments Assessed seated, er/IR adducted   Strength Assessment Site Shoulder   Right/Left Shoulder Right   Right Shoulder Flexion 5/5  4/5 previous   Right Shoulder ABduction 5/5  4/5 previous   Right Shoulder Internal Rotation 5/5  4+/5 previous   Right Shoulder External Rotation 5/5  4/5 previous                  OT Treatments/Exercises (OP) - 08/26/16 1307      Exercises   Exercises Shoulder     Shoulder Exercises: Supine   Protraction PROM;5 reps;Strengthening;15 reps   Protraction Weight (lbs) 2   Horizontal ABduction PROM;5 reps;Strengthening;15 reps  Horizontal ABduction Weight (lbs) 2   External Rotation PROM;5 reps;Strengthening;15 reps   External Rotation Weight (lbs) 2   Internal Rotation PROM;5 reps;Strengthening;15 reps   Internal Rotation Weight (lbs) 2   Flexion PROM;5 reps;Strengthening;15 reps   Shoulder Flexion Weight (lbs) 2   ABduction PROM;5 reps;Strengthening;15 reps   Shoulder ABduction Weight (lbs) 2     Shoulder Exercises: ROM/Strengthening   Cybex Press 3 plate;20 reps   Cybex Row 3 plate;20 reps   Ball on Wall 2' flexion 2' abduction-red weighted ball   Other ROM/Strengthening Exercises green theraband strengthening: PNF patterns, er/IR, horizontal abduction     Shoulder Exercises: Stretch   Internal Rotation Stretch 2 reps  20 seconds               OT Education -  08/26/16 1349    Education provided Yes   Education Details green theraband strengthening: PNF patterns, er/IR, horizontal abduction   Person(s) Educated Patient   Methods Explanation;Demonstration;Handout   Comprehension Verbalized understanding;Returned demonstration          OT Short Term Goals - 08/26/16 1351      OT SHORT TERM GOAL #1   Title Pt will be provided with and educated on HEP.    Time 4   Period Weeks   Status Achieved     OT SHORT TERM GOAL #2   Title Pt will decrease pain to 2/10 or less in RUE during daily tasks.    Time 4   Period Weeks   Status Achieved     OT SHORT TERM GOAL #3   Title Pt will decrease fascial restrictions in RUE to trace amounts to improve mobility of RUE during overhead reaching tasks.    Time 4   Period Weeks   Status Achieved     OT SHORT TERM GOAL #4   Title Pt will increase A/ROM of RUE to [facility] to improve ability to reach into overhead cabinets or shelves during daily tasks.    Time 4   Period Weeks   Status Achieved     OT SHORT TERM GOAL #5   Title Pt will increase RUE strength to 4+/5 to improve ability to lift weighted items at shoulder level or above using RUE as dominant.    Time 4   Period Weeks   Status Achieved                  Plan - 08/26/16 1350    Clinical Impression Statement A: Reassessment completed this session, pt has made excellent progress with therapy sessions and has met all goals. Pt is now able to complete all ADL and IADL tasks with independence and no pain. Pt does continue to have difficulty with reaching behind his back into internal rotation. Occasional soreness experienced with strenuous tasks. OT reviewed HEP with pt, discussed frequency of exercises at home. Pt is agreeable to discharge.    Plan P: Discharge pt   OT Home Exercise Plan 10/11: shoulder stretches; 10/18: A/ROM; 11/1: scapular theraband; 11/22: green theraband strengthening; 12/8: green theraband strengthening-pnf, er/IR,  horizontal abduction   Consulted and Agree with Plan of Care Patient      Patient will benefit from skilled therapeutic intervention in order to improve the following deficits and impairments:  Impaired flexibility, Decreased strength, Pain, Decreased range of motion, Increased fascial restricitons, Impaired UE functional use  Visit Diagnosis: Stiffness of right shoulder, not elsewhere classified  Other symptoms and signs involving the musculoskeletal system  Acute  pain of right shoulder    Problem List Patient Active Problem List   Diagnosis Date Noted  . Coronary artery disease involving native coronary artery of native heart without angina pectoris 05/05/2016  . DM 02/11/2009  . HYPERLIPIDEMIA 02/11/2009  . HTN (hypertension) 02/11/2009  . CAD 02/11/2009   Guadelupe Sabin, OTR/L  [phone] 08/26/2016, 1:56 PM  [place] [address] [place], [place], [address] Phone: [phone]   Fax:  [phone]  Name: NADEEM ROMANOSKI MRN: [id number] Date of Birth: [birth year]    OCCUPATIONAL THERAPY DISCHARGE SUMMARY  Visits from Start of Care: 18  Current functional level related to goals / functional outcomes: See above. Excellent progress during therapy. Pt is able to complete all ADL and IADL tasks with independence, minimal soreness experienced after strenuous activities.    Remaining deficits: Pt continues to have limitations in IR when reaching behind his back.    Education / Equipment: Provided new green theraband strengthening HEP, reviewed and discussed previously provided HEP.  Plan: Patient agrees to discharge.  Patient goals were met. Patient is being discharged due to meeting the stated rehab goals.  ?????

## 2016-08-26 NOTE — Patient Instructions (Signed)
Strengthening: Chest Pull - Resisted   Hold Theraband in front of body with hands about shoulder width a part. Pull band a part and back together slowly. Repeat __10-15__ times. Complete __1__ set(s) per session.. Repeat __1__ session(s) per day.  http://orth.exer.us/926   Copyright  VHI. All rights reserved.   PNF Strengthening: Resisted   Standing with resistive band around each hand, bring right arm up and away, thumb back. Repeat _10-15___ times per set. Do __1__ sets per session. Do ___1_ sessions per day.         Resisted External Rotation: in Neutral - Bilateral   Sit or stand, tubing in both hands, elbows at sides, bent to 90, forearms forward. Pinch shoulder blades together and rotate forearms out. Keep elbows at sides. Repeat __10-15__ times per set. Do __1__ sets per session. Do ___1_ sessions per day.  http://orth.exer.us/966   Copyright  VHI. All rights reserved.   PNF Strengthening: Resisted   Standing, hold resistive band above head. Bring right arm down and out from side. Repeat _10-15___ times per set. Do _1___ sets per session. Do ___1_ sessions per day.  http://orth.exer.us/922   Copyright  VHI. All rights reserved.  

## 2016-11-14 DIAGNOSIS — E119 Type 2 diabetes mellitus without complications: Secondary | ICD-10-CM | POA: Diagnosis not present

## 2016-11-14 DIAGNOSIS — Z125 Encounter for screening for malignant neoplasm of prostate: Secondary | ICD-10-CM | POA: Diagnosis not present

## 2016-11-14 DIAGNOSIS — I213 ST elevation (STEMI) myocardial infarction of unspecified site: Secondary | ICD-10-CM | POA: Diagnosis not present

## 2016-11-14 DIAGNOSIS — Z79899 Other long term (current) drug therapy: Secondary | ICD-10-CM | POA: Diagnosis not present

## 2016-11-21 DIAGNOSIS — E119 Type 2 diabetes mellitus without complications: Secondary | ICD-10-CM | POA: Diagnosis not present

## 2016-11-21 DIAGNOSIS — E785 Hyperlipidemia, unspecified: Secondary | ICD-10-CM | POA: Diagnosis not present

## 2016-11-21 DIAGNOSIS — I2510 Atherosclerotic heart disease of native coronary artery without angina pectoris: Secondary | ICD-10-CM | POA: Diagnosis not present

## 2017-02-22 DIAGNOSIS — Z79899 Other long term (current) drug therapy: Secondary | ICD-10-CM | POA: Diagnosis not present

## 2017-02-22 DIAGNOSIS — E785 Hyperlipidemia, unspecified: Secondary | ICD-10-CM | POA: Diagnosis not present

## 2017-02-22 DIAGNOSIS — E119 Type 2 diabetes mellitus without complications: Secondary | ICD-10-CM | POA: Diagnosis not present

## 2017-03-02 DIAGNOSIS — E119 Type 2 diabetes mellitus without complications: Secondary | ICD-10-CM | POA: Diagnosis not present

## 2017-03-02 DIAGNOSIS — E876 Hypokalemia: Secondary | ICD-10-CM | POA: Diagnosis not present

## 2017-04-28 DIAGNOSIS — E119 Type 2 diabetes mellitus without complications: Secondary | ICD-10-CM | POA: Diagnosis not present

## 2017-05-09 NOTE — Progress Notes (Signed)
Cardiology Office Note  Date: 05/10/2017   ID: [place], [place] [birth year], MRN [id number]  PCP: Asencion Noble, MD  Primary Cardiologist: Rozann Lesches, MD   Chief Complaint  Patient presents with  . Coronary Artery Disease    History of Present Illness: Jonathan Walsh is a 61 y.o. male that I met in consultation back in August 2017.He presents today for a routine follow-up visit. States that he is doing well without angina symptoms. He walks 2 miles most days of the week. Reports NYHA class II dyspnea. He has been taking care of his mother more recently with failing health.  He had a follow-up exercise Myoview last August which was low risk with normal LVEF. I went over the results today.  He continues to follow with Dr. Willey Blade for routine lab work, states that his recent hemoglobin A1c was under 7.  I reviewed his current medications which are outlined below. He reports no intolerances. Blood pressure low normal today, he is asymptomatic.  I personally reviewed his ECG today which shows sinus rhythm with nonspecific T-wave changes and possible old inferior infarct pattern.  Past Medical History:  Diagnosis Date  . Coronary artery disease    Angioplasty of OM 2002  . Diverticulosis   . Essential hypertension   . History of colonic polyps    Colonoscopy 2011  . History of non-ST elevation myocardial infarction (NSTEMI)    2002  . Hyperlipidemia   . Nephrolithiasis   . Type 2 diabetes mellitus ([place])     History reviewed. No pertinent surgical history.  Current Outpatient Prescriptions  Medication Sig Dispense Refill  . aspirin 325 MG tablet Take 325 mg by mouth daily.      . canagliflozin (INVOKANA) 300 MG TABS tablet Take 300 mg by mouth daily before breakfast.    . glimepiride (AMARYL) 4 MG tablet Take 4 mg by mouth daily with breakfast.    . Liraglutide (VICTOZA Harris) Inject 18 Units into the skin.    . metFORMIN (GLUCOPHAGE) 500 MG tablet Take 500 mg by mouth. 4  tabs every morning    . metoprolol succinate (TOPROL-XL) 25 MG 24 hr tablet Take 25 mg by mouth.      . nitroGLYCERIN (NITROSTAT) 0.4 MG SL tablet Place 0.4 mg under the tongue every 5 (five) minutes as needed for chest pain.    . ramipril (ALTACE) 2.5 MG tablet Take 2.5 mg by mouth daily.      . rosuvastatin (CRESTOR) 20 MG tablet Take 20 mg by mouth daily.     No current facility-administered medications for this visit.    Allergies:  Patient has no known allergies.   Social History: The patient  reports that he has never smoked. He has never used smokeless tobacco. He reports that he does not drink alcohol or use drugs.   ROS:  Please see the history of present illness. Otherwise, complete review of systems is positive for none.  All other systems are reviewed and negative.   Physical Exam: VS:  BP (!) 98/58 (BP Location: Right Arm)   Pulse 85   Ht _0  (1.727 m)   Wt 197 lb (89.4 kg)   SpO2 97%   BMI 29.95 kg/m , BMI Body mass index is 29.95 kg/m.  Wt Readings from Last 3 Encounters:  05/10/17 197 lb (89.4 kg)  05/05/16 197 lb 12.8 oz (89.7 kg)  04/07/16 199 lb (90.3 kg)    General: Overweight male, appears comfortable  at rest. HEENT: Conjunctiva and lids normal, oropharynx clear. Neck: Supple, no elevated JVP or carotid bruits, no thyromegaly. Lungs: Clear to auscultation, nonlabored breathing at rest. Cardiac: Regular rate and rhythm, no S3 or significant systolic murmur, no pericardial rub. Abdomen: Soft, nontender, bowel sounds present, no guarding or rebound. Extremities: No pitting edema, distal pulses 2+. Skin: Warm and dry. Musculoskeletal: No kyphosis. Neuropsychiatric: Alert and oriented x3, affect grossly appropriate.  ECG: I personally reviewed the tracing from 05/05/2016 which showed sinus rhythm with nonspecific ST-T changes.  Recent Labwork:  February 2017: BUN 13, creatinine 0.8, potassium 4.5, AST 19, ALT 24, hemoglobin 17.2, platelets 245, cholesterol  160, triglycerides 160, HDL 45, LDL 83, hemoglobin A1c 7.7  Other Studies Reviewed Today:  Exercise Myoview 05/12/2016:  Blood pressure demonstrated a normal response to exercise.  There was no ST segment deviation noted during stress.  The study is normal.  This is a low risk study.  The left ventricular ejection fraction is hyperdynamic (>65%).   Some thinning of the inferior wall likely from motion no ischemia or infarction EF 72%  Assessment and Plan:  1. CAD status post angioplasty of the obtuse marginal back in 2002. He is doing well without angina symptoms and had a low risk Myoview study last year. No changes to current regimen. Keep up regular walking plan.  2. Hyperlipidemia, continues on Crestor with follow-up per Dr. Willey Blade.  3. Type 2 diabetes mellitus, hemoglobin A1c under 7% by report. Keep follow-up with Dr. Willey Blade.  4. History of hypertension, blood pressure low normal today on current regimen. He was asymptomatic.  Current medicines were reviewed with the patient today.   Orders Placed This Encounter  Procedures  . EKG 12-Lead    Disposition: Follow-up in one year.  Signed, Satira Sark, MD, [facility] 05/10/2017 9:52 AM    [facility] at [facility] [address] [place], [place] [address] Phone: [phone]; Fax: [phone]

## 2017-05-10 ENCOUNTER — Ambulatory Visit (INDEPENDENT_AMBULATORY_CARE_PROVIDER_SITE_OTHER): Payer: 59 | Admitting: Cardiology

## 2017-05-10 ENCOUNTER — Encounter: Payer: Self-pay | Admitting: Cardiology

## 2017-05-10 VITALS — BP 98/58 | HR 85 | Ht 68.0 in | Wt 197.0 lb

## 2017-05-10 DIAGNOSIS — E119 Type 2 diabetes mellitus without complications: Secondary | ICD-10-CM

## 2017-05-10 DIAGNOSIS — E782 Mixed hyperlipidemia: Secondary | ICD-10-CM

## 2017-05-10 DIAGNOSIS — I2510 Atherosclerotic heart disease of native coronary artery without angina pectoris: Secondary | ICD-10-CM

## 2017-05-10 DIAGNOSIS — I10 Essential (primary) hypertension: Secondary | ICD-10-CM

## 2017-05-10 NOTE — Patient Instructions (Signed)
Your physician wants you to follow-up in: 1 year with Dr.McDowell You will receive a reminder letter in the mail two months in advance. If you don't receive a letter, please call our office to schedule the follow-up appointment.      Your physician recommends that you continue on your current medications as directed. Please refer to the Current Medication list given to you today.    If you need a refill on your cardiac medications before your next appointment, please call your pharmacy.      No lab work or testing ordered today.        Thank you for choosing [facility] !          

## 2017-06-06 DIAGNOSIS — E119 Type 2 diabetes mellitus without complications: Secondary | ICD-10-CM | POA: Diagnosis not present

## 2017-06-06 DIAGNOSIS — E875 Hyperkalemia: Secondary | ICD-10-CM | POA: Diagnosis not present

## 2017-06-12 DIAGNOSIS — E875 Hyperkalemia: Secondary | ICD-10-CM | POA: Diagnosis not present

## 2017-06-12 DIAGNOSIS — E119 Type 2 diabetes mellitus without complications: Secondary | ICD-10-CM | POA: Diagnosis not present

## 2017-09-21 DIAGNOSIS — E875 Hyperkalemia: Secondary | ICD-10-CM | POA: Diagnosis not present

## 2017-09-21 DIAGNOSIS — E119 Type 2 diabetes mellitus without complications: Secondary | ICD-10-CM | POA: Diagnosis not present

## 2017-09-29 DIAGNOSIS — H05212 Displacement (lateral) of globe, left eye: Secondary | ICD-10-CM | POA: Diagnosis not present

## 2017-09-29 DIAGNOSIS — E1159 Type 2 diabetes mellitus with other circulatory complications: Secondary | ICD-10-CM | POA: Diagnosis not present

## 2017-10-02 DIAGNOSIS — E11319 Type 2 diabetes mellitus with unspecified diabetic retinopathy without macular edema: Secondary | ICD-10-CM | POA: Diagnosis not present

## 2017-10-02 DIAGNOSIS — E119 Type 2 diabetes mellitus without complications: Secondary | ICD-10-CM | POA: Diagnosis not present

## 2018-01-08 DIAGNOSIS — Z79899 Other long term (current) drug therapy: Secondary | ICD-10-CM | POA: Diagnosis not present

## 2018-01-08 DIAGNOSIS — E875 Hyperkalemia: Secondary | ICD-10-CM | POA: Diagnosis not present

## 2018-01-08 DIAGNOSIS — I2510 Atherosclerotic heart disease of native coronary artery without angina pectoris: Secondary | ICD-10-CM | POA: Diagnosis not present

## 2018-01-08 DIAGNOSIS — E1159 Type 2 diabetes mellitus with other circulatory complications: Secondary | ICD-10-CM | POA: Diagnosis not present

## 2018-01-15 DIAGNOSIS — E785 Hyperlipidemia, unspecified: Secondary | ICD-10-CM | POA: Diagnosis not present

## 2018-01-15 DIAGNOSIS — E119 Type 2 diabetes mellitus without complications: Secondary | ICD-10-CM | POA: Diagnosis not present

## 2018-03-30 DIAGNOSIS — E119 Type 2 diabetes mellitus without complications: Secondary | ICD-10-CM | POA: Diagnosis not present

## 2018-03-30 DIAGNOSIS — Z79899 Other long term (current) drug therapy: Secondary | ICD-10-CM | POA: Diagnosis not present

## 2018-04-05 DIAGNOSIS — E119 Type 2 diabetes mellitus without complications: Secondary | ICD-10-CM | POA: Diagnosis not present

## 2018-04-05 DIAGNOSIS — I2510 Atherosclerotic heart disease of native coronary artery without angina pectoris: Secondary | ICD-10-CM | POA: Diagnosis not present

## 2018-04-30 DIAGNOSIS — E119 Type 2 diabetes mellitus without complications: Secondary | ICD-10-CM | POA: Diagnosis not present

## 2018-04-30 DIAGNOSIS — E113211 Type 2 diabetes mellitus with mild nonproliferative diabetic retinopathy with macular edema, right eye: Secondary | ICD-10-CM | POA: Diagnosis not present

## 2018-04-30 DIAGNOSIS — E11319 Type 2 diabetes mellitus with unspecified diabetic retinopathy without macular edema: Secondary | ICD-10-CM | POA: Diagnosis not present

## 2018-05-15 ENCOUNTER — Ambulatory Visit: Payer: 59 | Admitting: Cardiology

## 2018-05-16 ENCOUNTER — Encounter: Payer: Self-pay | Admitting: Cardiology

## 2018-05-16 ENCOUNTER — Ambulatory Visit (INDEPENDENT_AMBULATORY_CARE_PROVIDER_SITE_OTHER): Payer: 59 | Admitting: Cardiology

## 2018-05-16 VITALS — BP 118/68 | HR 91 | Ht 68.0 in | Wt 201.0 lb

## 2018-05-16 DIAGNOSIS — I2510 Atherosclerotic heart disease of native coronary artery without angina pectoris: Secondary | ICD-10-CM

## 2018-05-16 DIAGNOSIS — E782 Mixed hyperlipidemia: Secondary | ICD-10-CM | POA: Diagnosis not present

## 2018-05-16 DIAGNOSIS — I10 Essential (primary) hypertension: Secondary | ICD-10-CM

## 2018-05-16 DIAGNOSIS — E119 Type 2 diabetes mellitus without complications: Secondary | ICD-10-CM

## 2018-05-16 NOTE — Addendum Note (Signed)
Addended byDebbora Lacrosse R on: 05/16/2018 02:04 PM   Modules accepted: Orders

## 2018-05-16 NOTE — Progress Notes (Signed)
Cardiology Office Note  Date: 05/16/2018   ID: Jonathan Walsh, Jonathan Walsh 1956-07-12, MRN [id number]  PCP: Asencion Noble, MD  Primary Cardiologist: Rozann Lesches, MD   Chief Complaint  Patient presents with  . Coronary Artery Disease    History of Present Illness: Jonathan Walsh is a 62 y.o. male last seen in August 2018.  He is here for a routine follow-up visit.  States that he has been doing fairly well overall.  He does not report any angina or nitroglycerin use, stable NYHA class I-II dyspnea with typical activities.  He still walks on a local track about 2 miles at a time, at least a few days a week.  Stamina has not changed.  He has had no palpitations or syncope.  I reviewed his medications which are outlined below.  Cardiac regimen includes aspirin, Toprol-XL, Altace, Crestor, and as needed nitroglycerin.  I did talk with him about reducing aspirin dose from 325 mg daily to 81 mg daily.  I personally reviewed his ECG today which shows sinus rhythm with old inferior infarct pattern and nonspecific T wave changes.  We are requesting his most recent lab work from Dr. Willey Blade.  Past Medical History:  Diagnosis Date  . Coronary artery disease    Angioplasty of OM 2002  . Diverticulosis   . Essential hypertension   . History of colonic polyps    Colonoscopy 2011  . History of non-ST elevation myocardial infarction (NSTEMI)    2002  . Hyperlipidemia   . Nephrolithiasis   . Type 2 diabetes mellitus ([place])     History reviewed. No pertinent surgical history.  Current Outpatient Medications  Medication Sig Dispense Refill  . aspirin 325 MG tablet Take 325 mg by mouth daily.      . canagliflozin (INVOKANA) 300 MG TABS tablet Take 300 mg by mouth daily before breakfast.    . glimepiride (AMARYL) 4 MG tablet Take 4 mg by mouth daily with breakfast.    . Liraglutide (VICTOZA [place]) Inject 18 Units into the skin.    . metFORMIN (GLUCOPHAGE) 500 MG tablet Take 500 mg by mouth. 4 tabs  every morning    . metoprolol succinate (TOPROL-XL) 25 MG 24 hr tablet Take 25 mg by mouth.      . nitroGLYCERIN (NITROSTAT) 0.4 MG SL tablet Place 0.4 mg under the tongue every 5 (five) minutes as needed for chest pain.    . ramipril (ALTACE) 2.5 MG tablet Take 2.5 mg by mouth daily.      . rosuvastatin (CRESTOR) 20 MG tablet Take 20 mg by mouth daily.     No current facility-administered medications for this visit.    Allergies:  Patient has no known allergies.   Social History: The patient  reports that he has never smoked. He has never used smokeless tobacco. He reports that he does not drink alcohol or use drugs.   ROS:  Please see the history of present illness. Otherwise, complete review of systems is positive for none.  All other systems are reviewed and negative.   Physical Exam: VS:  BP 118/68 (BP Location: Left Arm)   Pulse 91   Ht 5\' 8"  (1.727 m)   Wt 201 lb (91.2 kg)   SpO2 98%   BMI 30.56 kg/m , BMI Body mass index is 30.56 kg/m.  Wt Readings from Last 3 Encounters:  05/16/18 201 lb (91.2 kg)  05/10/17 197 lb (89.4 kg)  05/05/16 197 lb 12.8 oz (89.7  kg)    General: Patient appears comfortable at rest. HEENT: Conjunctiva and lids normal, oropharynx clear. Neck: Supple, no elevated JVP or carotid bruits, no thyromegaly. Lungs: Clear to auscultation, nonlabored breathing at rest. Cardiac: Regular rate and rhythm, no S3 or significant systolic murmur. Abdomen: Soft, nontender, bowel sounds present. Extremities: No pitting edema, distal pulses 2+. Skin: Warm and dry. Musculoskeletal: No kyphosis. Neuropsychiatric: Alert and oriented x3, affect grossly appropriate.  ECG: I personally reviewed the tracing from 05/10/2017 which showed sinus rhythm with old inferior infarct pattern and nonspecific T wave changes.  Other Studies Reviewed Today:  Exercise Myoview 05/12/2016:  Blood pressure demonstrated a normal response to exercise.  There was no ST segment deviation  noted during stress.  The study is normal.  This is a low risk study.  The left ventricular ejection fraction is hyperdynamic (>65%).   Some thinning of the inferior wall likely from motion no ischemia or infarction EF 72%  Assessment and Plan:  1.  CAD with history of angioplasty to the obtuse marginal in 2002.  He continues to do well without recurrent angina and has good exercise tolerance.  Ischemic testing from 2017 was low risk as well.  I reviewed his ECG today.  Plan to continue medical therapy and observation.  Aspirin being reduced to 81 mg daily.  2.  Mixed hyperlipidemia, on Crestor.  Requesting lab work from Dr. Willey Blade.  3.  Type 2 diabetes mellitus, continues on medical therapy per Dr. Willey Blade.  4.  Essential hypertension, blood pressure is well controlled today on Toprol-XL and Altace.  Current medicines were reviewed with the patient today.   Orders Placed This Encounter  Procedures  . EKG 12-Lead    Disposition: Follow-up in 1 year.  Signed, Satira Sark, MD, [facility] 05/16/2018 1:39 PM    [facility] [address] [place], [place] [address] Phone: [phone]; Fax: [phone]

## 2018-05-16 NOTE — Patient Instructions (Addendum)
Medication Instructions:  DECREASE ASPIRIN TO 80 MG DAILY    Labwork: I WILL REQUEST LABS FROM PCP  Testing/Procedures: NONE  Follow-Up: Your physician wants you to follow-up in: 1 YEAR.  You will receive a reminder letter in the mail two months in advance. If you don't receive a letter, please call our office to schedule the follow-up appointment.   Any Other Special Instructions Will Be Listed Below (If Applicable).     If you need a refill on your cardiac medications before your next appointment, please call your pharmacy.

## 2018-07-24 DIAGNOSIS — Z79899 Other long term (current) drug therapy: Secondary | ICD-10-CM | POA: Diagnosis not present

## 2018-07-24 DIAGNOSIS — E119 Type 2 diabetes mellitus without complications: Secondary | ICD-10-CM | POA: Diagnosis not present

## 2018-07-24 DIAGNOSIS — I2510 Atherosclerotic heart disease of native coronary artery without angina pectoris: Secondary | ICD-10-CM | POA: Diagnosis not present

## 2018-07-28 DIAGNOSIS — E119 Type 2 diabetes mellitus without complications: Secondary | ICD-10-CM | POA: Diagnosis not present

## 2018-07-30 DIAGNOSIS — Z23 Encounter for immunization: Secondary | ICD-10-CM | POA: Diagnosis not present

## 2018-07-30 DIAGNOSIS — I2510 Atherosclerotic heart disease of native coronary artery without angina pectoris: Secondary | ICD-10-CM | POA: Diagnosis not present

## 2018-07-30 DIAGNOSIS — E113211 Type 2 diabetes mellitus with mild nonproliferative diabetic retinopathy with macular edema, right eye: Secondary | ICD-10-CM | POA: Diagnosis not present

## 2018-08-02 DIAGNOSIS — R1084 Generalized abdominal pain: Secondary | ICD-10-CM | POA: Diagnosis not present

## 2018-08-02 DIAGNOSIS — Z87442 Personal history of urinary calculi: Secondary | ICD-10-CM | POA: Diagnosis not present

## 2018-10-24 DIAGNOSIS — L570 Actinic keratosis: Secondary | ICD-10-CM | POA: Diagnosis not present

## 2018-10-24 DIAGNOSIS — D1801 Hemangioma of skin and subcutaneous tissue: Secondary | ICD-10-CM | POA: Diagnosis not present

## 2018-10-24 DIAGNOSIS — E1139 Type 2 diabetes mellitus with other diabetic ophthalmic complication: Secondary | ICD-10-CM | POA: Diagnosis not present

## 2018-10-24 DIAGNOSIS — L821 Other seborrheic keratosis: Secondary | ICD-10-CM | POA: Diagnosis not present

## 2018-10-24 DIAGNOSIS — L814 Other melanin hyperpigmentation: Secondary | ICD-10-CM | POA: Diagnosis not present

## 2018-10-31 DIAGNOSIS — E1139 Type 2 diabetes mellitus with other diabetic ophthalmic complication: Secondary | ICD-10-CM | POA: Diagnosis not present

## 2018-10-31 DIAGNOSIS — I2510 Atherosclerotic heart disease of native coronary artery without angina pectoris: Secondary | ICD-10-CM | POA: Diagnosis not present

## 2019-01-23 DIAGNOSIS — E1139 Type 2 diabetes mellitus with other diabetic ophthalmic complication: Secondary | ICD-10-CM | POA: Diagnosis not present

## 2019-01-23 DIAGNOSIS — Z125 Encounter for screening for malignant neoplasm of prostate: Secondary | ICD-10-CM | POA: Diagnosis not present

## 2019-01-23 DIAGNOSIS — Z79899 Other long term (current) drug therapy: Secondary | ICD-10-CM | POA: Diagnosis not present

## 2019-01-23 DIAGNOSIS — I2510 Atherosclerotic heart disease of native coronary artery without angina pectoris: Secondary | ICD-10-CM | POA: Diagnosis not present

## 2019-01-30 DIAGNOSIS — E1139 Type 2 diabetes mellitus with other diabetic ophthalmic complication: Secondary | ICD-10-CM | POA: Diagnosis not present

## 2019-01-30 DIAGNOSIS — E785 Hyperlipidemia, unspecified: Secondary | ICD-10-CM | POA: Diagnosis not present

## 2019-03-28 ENCOUNTER — Other Ambulatory Visit: Payer: Self-pay

## 2019-03-28 ENCOUNTER — Emergency Department (HOSPITAL_COMMUNITY)
Admission: EM | Admit: 2019-03-28 | Discharge: 2019-03-29 | Disposition: A | Discharge status: Home / Self Care | Payer: 59 | Attending: Emergency Medicine | Admitting: Emergency Medicine

## 2019-03-28 ENCOUNTER — Encounter (HOSPITAL_COMMUNITY): Payer: Self-pay | Admitting: Emergency Medicine

## 2019-03-28 DIAGNOSIS — E119 Type 2 diabetes mellitus without complications: Secondary | ICD-10-CM | POA: Insufficient documentation

## 2019-03-28 DIAGNOSIS — Z79899 Other long term (current) drug therapy: Secondary | ICD-10-CM | POA: Diagnosis not present

## 2019-03-28 DIAGNOSIS — Z7984 Long term (current) use of oral hypoglycemic drugs: Secondary | ICD-10-CM | POA: Diagnosis not present

## 2019-03-28 DIAGNOSIS — I10 Essential (primary) hypertension: Secondary | ICD-10-CM | POA: Insufficient documentation

## 2019-03-28 DIAGNOSIS — I259 Chronic ischemic heart disease, unspecified: Secondary | ICD-10-CM | POA: Insufficient documentation

## 2019-03-28 DIAGNOSIS — Z7982 Long term (current) use of aspirin: Secondary | ICD-10-CM | POA: Insufficient documentation

## 2019-03-28 DIAGNOSIS — R040 Epistaxis: Secondary | ICD-10-CM | POA: Diagnosis not present

## 2019-03-28 MED ORDER — SILVER NITRATE-POT NITRATE 75-25 % EX MISC
1.0000 "application " | Freq: Once | CUTANEOUS | Status: AC
  Administered 2019-03-29: 1 via TOPICAL
  Filled 2019-03-28: qty 2

## 2019-03-28 MED ORDER — OXYMETAZOLINE HCL 0.05 % NA SOLN
1.0000 | Freq: Once | NASAL | Status: AC
  Administered 2019-03-29: 1 via NASAL
  Filled 2019-03-28: qty 30

## 2019-03-28 NOTE — ED Triage Notes (Signed)
Patient states that he had a sudden nosebleed around 3:20 pm. that bleed profusely. Patient has packed his nose with tissue. Patient tried to remove the packing several times and his nose continued to bled.

## 2019-03-29 MED ORDER — LIDOCAINE VISCOUS HCL 2 % MT SOLN
OROMUCOSAL | Status: AC
  Filled 2019-03-29: qty 15

## 2019-03-29 MED ORDER — TRANEXAMIC ACID 1000 MG/10ML IV SOLN
500.0000 mg | Freq: Once | INTRAVENOUS | Status: AC
  Administered 2019-03-29: 01:00:00 500 mg via TOPICAL
  Filled 2019-03-29: qty 10

## 2019-03-29 MED ORDER — CEPHALEXIN 500 MG PO CAPS: 500.0000 mg | ORAL_CAPSULE | Freq: Three times a day (TID) | ORAL | 0 refills | Status: DC

## 2019-03-29 NOTE — ED Provider Notes (Signed)
[facility] EMERGENCY DEPARTMENT Provider Note   CSN: [id number] Arrival date & time: 03/28/19  2209  Time seen 11:40 PM  History   Chief Complaint Chief Complaint  Patient presents with  . Epistaxis    HPI Jonathan Walsh is a 63 y.o. male.     HPI patient states he was driving his vehicle about 3:20 PM today and he started having spontaneous bleeding from his left nostril.  He states he can put tissue in his nose and it will stop bleeding however if he removes the tissue it starts bleeding again.  He denies any recent URI symptoms such as coughing or sneezing.  He states he is never had bleeding like this before.  He states he does take a baby aspirin a day and does note if he has a abrasion or scrape he bleeds freely. He states he was taken off Victoza a few months ago and started on Ozempic.  That is the only change in his medication.  PCP Asencion Noble, MD   Past Medical History:  Diagnosis Date  . Coronary artery disease    Angioplasty of OM 2002  . Diverticulosis   . Essential hypertension   . History of colonic polyps    Colonoscopy 2011  . History of non-ST elevation myocardial infarction (NSTEMI)    2002  . Hyperlipidemia   . Nephrolithiasis   . Type 2 diabetes mellitus [facility])     Patient Active Problem List   Diagnosis Date Noted  . Coronary artery disease involving native coronary artery of native heart without angina pectoris 05/05/2016  . DM 02/11/2009  . HYPERLIPIDEMIA 02/11/2009  . HTN (hypertension) 02/11/2009  . CAD 02/11/2009    History reviewed. No pertinent surgical history.      Home Medications    Prior to Admission medications   Medication Sig Start Date End Date Taking? Authorizing Provider  aspirin EC 81 MG tablet Take 81 mg by mouth daily.    [provider]  canagliflozin (INVOKANA) 300 MG TABS tablet Take 300 mg by mouth daily before breakfast.    [provider]  glimepiride (AMARYL) 4 MG tablet Take 4 mg by mouth  daily with breakfast.    [provider]  Liraglutide (VICTOZA [place]) Inject 18 Units into the skin.    [provider]  metFORMIN (GLUCOPHAGE) 500 MG tablet Take 500 mg by mouth. 4 tabs every morning    [provider]  metoprolol succinate (TOPROL-XL) 25 MG 24 hr tablet Take 25 mg by mouth.      [provider]  nitroGLYCERIN (NITROSTAT) 0.4 MG SL tablet Place 0.4 mg under the tongue every 5 (five) minutes as needed for chest pain.    [provider]  ramipril (ALTACE) 2.5 MG tablet Take 2.5 mg by mouth daily.      [provider]  rosuvastatin (CRESTOR) 20 MG tablet Take 20 mg by mouth daily.    [provider]    Family History Family History  Problem Relation Age of Onset  . CAD Father        Status post CABG  . Stroke Father   . Diabetes Mellitus II Father   . Arthritis Mother   . Asthma Sister   . CAD Paternal Grandfather     Social History Social History   Tobacco Use  . Smoking status: Never Smoker  . Smokeless tobacco: Never Used  Substance Use Topics  . Alcohol use: No  . Drug use:  No     Allergies   Patient has no known allergies.   Review of Systems Review of Systems  All other systems reviewed and are negative.    Physical Exam Updated Vital Signs BP (!) 141/95 (BP Location: Right Arm)   Pulse 83   Temp 97.9 F (36.6 C) (Oral)   Resp 18   Ht 5\' 8"  (1.727 m)   Wt 84.4 kg   SpO2 99%   BMI 28.28 kg/m   Vital signs normal    Physical Exam Vitals signs and nursing note reviewed.  Constitutional:      General: He is not in acute distress.    Appearance: Normal appearance.  HENT:     Head: Normocephalic and atraumatic.     Nose:     Comments: Patient has an area on his mid anterior septum on the left that appears to be the source of bleeding. Eyes:     Extraocular Movements: Extraocular movements intact.     Conjunctiva/sclera: Conjunctivae normal.     Pupils: Pupils are equal,  round, and reactive to light.  Neck:     Musculoskeletal: Normal range of motion.  Cardiovascular:     Rate and Rhythm: Normal rate.     Pulses: Normal pulses.  Pulmonary:     Effort: Pulmonary effort is normal. No respiratory distress.  Musculoskeletal: Normal range of motion.  Skin:    General: Skin is warm and dry.  Neurological:     General: No focal deficit present.     Mental Status: He is alert and oriented to person, place, and time.     Cranial Nerves: No cranial nerve deficit.  Psychiatric:        Mood and Affect: Mood normal.        Behavior: Behavior normal.        Thought Content: Thought content normal.      ED Treatments / Results  Labs (all labs ordered are listed, but only abnormal results are displayed) Labs Reviewed - No data to display  EKG None  Radiology No results found.  Procedures Procedures (including critical care time)  Medications Ordered in ED Medications  silver nitrate applicators applicator 1 application (has no administration in time range)  oxymetazoline (AFRIN) 0.05 % nasal spray 1 spray (has no administration in time range)  tranexamic acid (CYKLOKAPRON) injection 500 mg (has no administration in time range)     Initial Impression / Assessment and Plan / ED Course  I have reviewed the triage vital signs and the nursing notes.  Pertinent labs & imaging results that were available during my care of the patient were reviewed by me and considered in my medical decision making (see chart for details).    Patient was examined at 12:35 AM I saw an area on his nasal septum on the left that appeared to be a bleeding site.  However when I touched it with a silver nitrate stick it started pouring blood.  An Afrin pledget was placed.  Patient placed pressure.  I am getting TXA to place in his nose.   12:47 AM patient continues to have bleeding, TXA was squirted on a cottonball placed in his left nostril.  Patient applied pressure  afterwards.  Recheck at 1:50 AM bleeding appears to have stopped.  Patient is going to ambulate to the bathroom to make sure bleeding has not returned.  Patient states while he was in the bathroom and started bleeding again.  2:20 AM a nasal rocket  was coated with viscous Xylocaine and placed in his left nostril, the balloon was inflated.  Patient was given aftercare instructions.  Final Clinical Impressions(s) / ED Diagnoses   Final diagnoses:  Left-sided epistaxis  Anterior epistaxis    ED Discharge Orders    None      Plan discharge  Rolland Porter, MD, Barbette Or, MD 03/29/19 [phone]

## 2019-03-29 NOTE — Discharge Instructions (Addendum)
If you should start bleeding again hold pressure on the sides of you nose.   You can put an ice pack on your forehead which will also help with the bleeding.  If it continues to bleed around the rhino rocket, you should return to the ED.  The rhino rocket needs to be removed by the ENT on-call, Dr. Benjamine Mola in about 5 days.  Call his office to get an appointment.  Take the antibiotic to help prevent his sinus infection from having the rocket in your nose.  Return to the emergency department if you get facial pain or fever.

## 2019-05-16 ENCOUNTER — Ambulatory Visit: Payer: 59 | Admitting: Cardiology

## 2019-06-19 ENCOUNTER — Ambulatory Visit: Payer: 59 | Admitting: Cardiology

## 2019-06-21 ENCOUNTER — Other Ambulatory Visit: Payer: Self-pay

## 2019-06-21 ENCOUNTER — Ambulatory Visit (INDEPENDENT_AMBULATORY_CARE_PROVIDER_SITE_OTHER): Payer: 59 | Admitting: Cardiology

## 2019-06-21 ENCOUNTER — Encounter: Payer: Self-pay | Admitting: Cardiology

## 2019-06-21 VITALS — BP 103/69 | HR 80 | Ht 68.0 in | Wt 188.0 lb

## 2019-06-21 DIAGNOSIS — I2510 Atherosclerotic heart disease of native coronary artery without angina pectoris: Secondary | ICD-10-CM

## 2019-06-21 DIAGNOSIS — E782 Mixed hyperlipidemia: Secondary | ICD-10-CM

## 2019-06-21 DIAGNOSIS — I10 Essential (primary) hypertension: Secondary | ICD-10-CM | POA: Diagnosis not present

## 2019-06-21 DIAGNOSIS — E119 Type 2 diabetes mellitus without complications: Secondary | ICD-10-CM

## 2019-06-21 NOTE — Progress Notes (Signed)
Cardiology Office Note  Date: 06/21/2019   ID: [name], [name] 01/31/1956, MRN [id number]  PCP:  Jonathan Noble, MD  Cardiologist:  Jonathan Lesches, MD Electrophysiologist:  None   Chief Complaint  Patient presents with  . Cardiac follow-up    History of Present Illness: Jonathan Walsh is a 63 y.o. male last seen in August 2019.  He presents for a routine visit.  Denies any angina symptoms or nitroglycerin use.  He still walks for exercise, most days of the week for about 30 to 40 minutes outdoors.  He reports NYHA class II dyspnea.  He is retired.  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  I reviewed his lab work from May as outlined below.  LDL was well controlled at 56.  He continues on Crestor with follow-up by Dr. Willey Walsh.  I personally reviewed his ECG today which shows sinus rhythm with R' in lead V1 and borderline IVCD.  Past Medical History:  Diagnosis Date  . Coronary artery disease    Angioplasty of OM 2002  . Diverticulosis   . Essential hypertension   . History of colonic polyps    Colonoscopy 2011  . History of non-ST elevation myocardial infarction (NSTEMI)    2002  . Hyperlipidemia   . Nephrolithiasis   . Type 2 diabetes mellitus ([place])     History reviewed. No pertinent surgical history.  Current Outpatient Medications  Medication Sig Dispense Refill  . aspirin EC 81 MG tablet Take 81 mg by mouth daily.    . canagliflozin (INVOKANA) 300 MG TABS tablet Take 300 mg by mouth daily before breakfast.    . glimepiride (AMARYL) 4 MG tablet Take 4 mg by mouth daily with breakfast.    . metFORMIN (GLUCOPHAGE) 500 MG tablet Take 500 mg by mouth. 4 tabs every morning    . metoprolol succinate (TOPROL-XL) 25 MG 24 hr tablet Take 25 mg by mouth.      . nitroGLYCERIN (NITROSTAT) 0.4 MG SL tablet Place 0.4 mg under the tongue every 5 (five) minutes as needed for chest pain.    . ramipril (ALTACE) 2.5 MG tablet Take 2.5 mg by mouth daily.      .  rosuvastatin (CRESTOR) 20 MG tablet Take 20 mg by mouth daily.    . Semaglutide,0.25 or 0.5MG /DOS, (OZEMPIC, 0.25 OR 0.5 MG/DOSE,) 2 MG/1.5ML SOPN      No current facility-administered medications for this visit.    Allergies:  Patient has no known allergies.   Social History: The patient  reports that he has never smoked. He has never used smokeless tobacco. He reports that he does not drink alcohol or use drugs.   ROS:  Please see the history of present illness. Otherwise, complete review of systems is positive for nosebleed back in the summer, no recurrence.  All other systems are reviewed and negative.   Physical Exam: VS:  BP 103/69 (BP Location: Left Arm)   Pulse 80   Ht 5\' 8"  (1.727 m)   Wt 188 lb (85.3 kg)   SpO2 98%   BMI 28.59 kg/m , BMI Body mass index is 28.59 kg/m.  Wt Readings from Last 3 Encounters:  06/21/19 188 lb (85.3 kg)  03/28/19 186 lb (84.4 kg)  05/16/18 201 lb (91.2 kg)    General: Patient appears comfortable at rest. HEENT: Conjunctiva and lids normal, wearing a mask. Neck: Supple, no elevated JVP or carotid bruits, no thyromegaly. Lungs: Clear to auscultation, nonlabored  breathing at rest. Cardiac: Regular rate and rhythm, no S3 or significant systolic murmur, no pericardial rub. Abdomen: Soft, nontender, bowel sounds present. Extremities: No pitting edema, distal pulses 2+. Skin: Warm and dry. Musculoskeletal: No kyphosis. Neuropsychiatric: Alert and oriented x3, affect grossly appropriate.  ECG:  An ECG dated 05/16/2018 was personally reviewed today and demonstrated:  Sinus rhythm with old inferior infarct pattern and nonspecific T wave changes.  Recent Labwork:  May 2020: BUN 15, creatinine 0.8, potassium 4.5, AST 18, ALT 23, hemoglobin 17.5, platelets 285, cholesterol 127, triglycerides 157, HDL 40, LDL 56, hemoglobin A1c 6.8%  Other Studies Reviewed Today:  Exercise Myoview 05/12/2016:  Blood pressure demonstrated a normal response to  exercise.  There was no ST segment deviation noted during stress.  The study is normal.  This is a low risk study.  The left ventricular ejection fraction is hyperdynamic (>65%).  Some thinning of the inferior wall likely from motion no ischemia or infarction EF 72%  Assessment and Plan:  1.  CAD status post angioplasty to the obtuse marginal in 2002.  He continues to do well on medical therapy, no active angina symptoms or nitroglycerin use.  Continue regular walking plan for exercise.  No changes made to present medications.  No clear indication for follow-up ischemic testing at this time.  2.  Mixed hyperlipidemia.  He remains on Crestor with recent LDL 56.  3.  Type 2 diabetes mellitus, he continues to follow with Dr. Willey Walsh and is on Amaryl, Glucophage, and Invokana.  Last hemoglobin A1c was 6.8%.  4.  Essential hypertension, blood pressure is normal today.  Continue Toprol-XL and Altace.  Medication Adjustments/Labs and Tests Ordered: Current medicines are reviewed at length with the patient today.  Concerns regarding medicines are outlined above.   Tests Ordered: Orders Placed This Encounter  Procedures  . EKG 12-Lead    Medication Changes: No orders of the defined types were placed in this encounter.   Disposition:  Follow up 1 year.  Signed, Jonathan Sark, MD, [facility] 06/21/2019 8:37 AM    [facility] [address], [place], [place] [address] Phone: [phone]; Fax: [phone]

## 2019-06-21 NOTE — Patient Instructions (Signed)

## 2020-02-28 ENCOUNTER — Telehealth: Payer: Self-pay | Admitting: *Deleted

## 2020-02-28 NOTE — Telephone Encounter (Signed)
[facility] (Dr Collene Mares) is scheduled for colonoscopy with Propofol on 03/04/2020 requesting clearance Phone [phone] Fax [phone]

## 2020-03-02 ENCOUNTER — Telehealth: Payer: Self-pay | Admitting: Cardiology

## 2020-03-02 NOTE — Telephone Encounter (Signed)
Pt called back and scheduled an apt 03/03/2020 8:00 AM Levell July,  NP for cardiac clearance

## 2020-03-02 NOTE — Telephone Encounter (Signed)
Primary Cardiologist:Samuel Domenic Polite, MD  Chart reviewed as part of pre-operative protocol coverage. Because of Jonathan Walsh's past medical history and time since last visit, he/she will require a follow-up visit in order to better assess preoperative cardiovascular risk.  Pre-op covering staff: - Please schedule appointment and call patient to inform them. - Please contact requesting surgeon's office via preferred method (i.e, phone, fax) to inform them of need for appointment prior to surgery.  If applicable, this message will also be routed to pharmacy pool and/or primary cardiologist for input on holding anticoagulant/antiplatelet agent as requested below so that this information is available at time of patient's appointment.   Deberah Pelton, NP  03/02/2020, 8:43 AM

## 2020-03-02 NOTE — Telephone Encounter (Signed)
Accessed patient's to see who called him. Reached out to [place] via secure chat and then transferred the call.

## 2020-03-02 NOTE — Progress Notes (Addendum)
Cardiology Office Note  Date: 03/03/2020   ID: Jonathan Walsh, Jonathan Walsh Aug 07, 1956, MRN [id number]  PCP:  Asencion Noble, MD  Cardiologist:  Rozann Lesches, MD Electrophysiologist:  None   Chief Complaint: Preop cardiac clearance.  History of CAD, HTN, HLD  History of Present Illness: Jonathan Walsh is a 64 y.o. male with a history of History of CAD (status post angioplasty to OM in 2002), HTN, HLD, diabetes type 2.  Last encounter 07/19/2019 with Dr. Domenic Polite.  He denied any anginal symptoms or nitroglycerin use.  We will continue to offer exercise most days of the week for about 30 to 40 minutes.  Reported NYHA II dyspnea.  He was continued on Crestor with recent LDL of 56.  He follows with Dr. Willey Blade for diabetes.  Last hemoglobin A1c was 6.8%.  He was continuing his Toprol-XL and Altace for hypertension.  He is here for preop cardiac clearance for undergoing a colonoscopy with propofol in the near future.  He denies any recent acute illnesses, hospitalizations.  Denies any progressive anginal or exertional symptoms, palpitations or arrhythmias, orthostatic symptoms, PND, orthopnea, bleeding, claudication-like symptoms, DVT or PE-like symptoms, or lower extremity edema.  States she had some recent lab work at Dr. Ria Comment office and his A1c was slightly elevated as well as his cholesterol.  We do not have those results yet.  Patient states otherwise his lab work was normal  Past Medical History:  Diagnosis Date  . Coronary artery disease    Angioplasty of OM 2002  . Diverticulosis   . Essential hypertension   . History of colonic polyps    Colonoscopy 2011  . History of non-ST elevation myocardial infarction (NSTEMI)    2002  . Hyperlipidemia   . Nephrolithiasis   . Type 2 diabetes mellitus ([place])     History reviewed. No pertinent surgical history.  Current Outpatient Medications  Medication Sig Dispense Refill  . aspirin EC 81 MG tablet Take 81 mg by mouth daily.    Marland Kitchen  glimepiride (AMARYL) 4 MG tablet Take 4 mg by mouth daily with breakfast.    . JARDIANCE 25 MG TABS tablet Take 25 mg by mouth daily.    . metFORMIN (GLUCOPHAGE) 500 MG tablet Take 500 mg by mouth. 4 tabs every morning    . metoprolol succinate (TOPROL-XL) 25 MG 24 hr tablet Take 25 mg by mouth.      . nitroGLYCERIN (NITROSTAT) 0.4 MG SL tablet Place 0.4 mg under the tongue every 5 (five) minutes as needed for chest pain.    . ramipril (ALTACE) 2.5 MG tablet Take 2.5 mg by mouth daily.      . rosuvastatin (CRESTOR) 20 MG tablet Take 20 mg by mouth daily.    . Semaglutide,0.25 or 0.5MG /DOS, (OZEMPIC, 0.25 OR 0.5 MG/DOSE,) 2 MG/1.5ML SOPN      No current facility-administered medications for this visit.   Allergies:  Patient has no known allergies.   Social History: The patient  reports that he has never smoked. He has never used smokeless tobacco. He reports that he does not drink alcohol and does not use drugs.   Family History: The patient's family history includes Arthritis in his mother; Asthma in his sister; CAD in his father and paternal grandfather; Diabetes Mellitus II in his father; Stroke in his father.   ROS:  Please see the history of present illness. Otherwise, complete review of systems is positive for none.  All other systems are reviewed and negative.  Physical Exam: VS:  BP 106/80   Pulse 74   Ht 5\' 8"  (1.727 m)   Wt 187 lb 9.6 oz (85.1 kg)   SpO2 98%   BMI 28.52 kg/m , BMI Body mass index is 28.52 kg/m.  Wt Readings from Last 3 Encounters:  03/03/20 187 lb 9.6 oz (85.1 kg)  06/21/19 188 lb (85.3 kg)  03/28/19 186 lb (84.4 kg)    General: Patient appears comfortable at rest. Neck: Supple, no elevated JVP or carotid bruits, no thyromegaly. Lungs: Clear to auscultation, nonlabored breathing at rest. Cardiac: Regular rate and rhythm, no S3 or significant systolic murmur, no pericardial rub. Abdomen: Soft, nontender, no hepatomegaly, bowel sounds present, no  guarding or rebound. Extremities: No pitting edema, distal pulses 2+. Skin: Warm and dry. Musculoskeletal: No kyphosis. Neuropsychiatric: Alert and oriented x3, affect grossly appropriate.  ECG:  An ECG dated 03/03/2020 was personally reviewed today and demonstrated:  Sinus rhythm rate of 76 nonspecific T wave abnormality  Recent Labwork: No results found for requested labs within last 8760 hours.  No results found for: CHOL, TRIG, HDL, CHOLHDL, VLDL, LDLCALC, LDLDIRECT  Other Studies Reviewed Today:  Exercise Myoview 05/12/2016:  Blood pressure demonstrated a normal response to exercise.  There was no ST segment deviation noted during stress.  The study is normal.  This is a low risk study.  The left ventricular ejection fraction is hyperdynamic (>65%).  Some thinning of the inferior wall likely from motion no ischemia or infarction EF 72%  Assessment and Plan:  1. Encounter for pre-operative cardiovascular clearance   2. CAD in native artery   3. Mixed hyperlipidemia   4. Essential hypertension    1. Encounter for pre-operative cardiovascular clearance Patient has a pending colonoscopy and here for cardiac clearance.   Revised cardiac risk index = 1 placing the perioperative risk of major cardiac event at 0.9%.  Patient is clear from a cardiac standpoint to undergo colonoscopy under moderate conscious sedation/monitored anesthesia care.  2. CAD in native artery Patient denies any recent progressive anginal or exertional symptoms.  Continue aspirin 81 mg, sublingual nitroglycerin as needed  3. Mixed hyperlipidemia 01/23/2019 TC 127 TRG 157, HDL 40, LDL 56.  Patient states he had recent lab work at PCP office and cholesterol was a little elevated.  We will attempt to obtain those results.  Continue Crestor 20 mg p.o. daily.  4. Essential hypertension Patient is normotensive today with a blood pressure of 106/80.  Continue Toprol-XL 25 mg daily as well as ramipril 2.5 mg  daily.  Medication Adjustments/Labs and Tests Ordered: Current medicines are reviewed at length with the patient today.  Concerns regarding medicines are outlined above.   Disposition: Follow-up with Dr Domenic Polite or APP 6 months  Signed, Levell July, NP 03/03/2020 8:33 AM    [facility] [place], [place], [place] [address] Phone: [phone]; Fax: [phone]

## 2020-03-03 ENCOUNTER — Other Ambulatory Visit: Payer: Self-pay

## 2020-03-03 ENCOUNTER — Ambulatory Visit (INDEPENDENT_AMBULATORY_CARE_PROVIDER_SITE_OTHER): Payer: 59 | Admitting: Family Medicine

## 2020-03-03 ENCOUNTER — Encounter: Payer: Self-pay | Admitting: Family Medicine

## 2020-03-03 VITALS — BP 106/80 | HR 74 | Ht 68.0 in | Wt 187.6 lb

## 2020-03-03 DIAGNOSIS — E782 Mixed hyperlipidemia: Secondary | ICD-10-CM

## 2020-03-03 DIAGNOSIS — Z01810 Encounter for preprocedural cardiovascular examination: Secondary | ICD-10-CM | POA: Diagnosis not present

## 2020-03-03 DIAGNOSIS — I10 Essential (primary) hypertension: Secondary | ICD-10-CM | POA: Diagnosis not present

## 2020-03-03 DIAGNOSIS — I2510 Atherosclerotic heart disease of native coronary artery without angina pectoris: Secondary | ICD-10-CM | POA: Diagnosis not present

## 2020-03-03 NOTE — Progress Notes (Signed)
Okay.  Thanks I will add at that note.  Thanks for letting me know.  We have not sent it out to the doctor performing the procedure yet

## 2020-03-03 NOTE — Patient Instructions (Signed)
Medication Instructions:    Your physician recommends that you continue on your current medications as directed. Please refer to the Current Medication list given to you today.  Labwork:  NONE  Testing/Procedures:  NONE  Follow-Up:  Your physician recommends that you schedule a follow-up appointment in: 6 months (office)  Any Other Special Instructions Will Be Listed Below (If Applicable).  If you need a refill on your cardiac medications before your next appointment, please call your pharmacy. 

## 2020-08-19 ENCOUNTER — Encounter: Payer: Self-pay | Admitting: Cardiology

## 2020-08-19 ENCOUNTER — Other Ambulatory Visit: Payer: Self-pay

## 2020-08-19 ENCOUNTER — Ambulatory Visit (INDEPENDENT_AMBULATORY_CARE_PROVIDER_SITE_OTHER): Payer: 59 | Admitting: Cardiology

## 2020-08-19 VITALS — BP 116/62 | HR 90 | Ht 68.0 in | Wt 190.0 lb

## 2020-08-19 DIAGNOSIS — E782 Mixed hyperlipidemia: Secondary | ICD-10-CM

## 2020-08-19 DIAGNOSIS — I25119 Atherosclerotic heart disease of native coronary artery with unspecified angina pectoris: Secondary | ICD-10-CM

## 2020-08-19 NOTE — Patient Instructions (Signed)
Medication Instructions:  Your physician recommends that you continue on your current medications as directed. Please refer to the Current Medication list given to you today.  *If you need a refill on your cardiac medications before your next appointment, please call your pharmacy*   Lab Work: None today If you have labs (blood work) drawn today and your tests are completely normal, you will receive your results only by: Marland Kitchen MyChart Message (if you have MyChart) OR . A paper copy in the mail If you have any lab test that is abnormal or we need to change your treatment, we will call you to review the results.   Testing/Procedures: None today   Follow-Up: At [facility], you and your health needs are our priority.  As part of our continuing mission to provide you with exceptional heart care, we have created designated Provider Care Teams.  These Care Teams include your primary Cardiologist (physician) and Advanced Practice Providers (APPs -  Physician Assistants and Nurse Practitioners) who all work together to provide you with the care you need, when you need it.  We recommend signing up for the patient portal called "MyChart".  Sign up information is provided on this After Visit Summary.  MyChart is used to connect with patients for Virtual Visits (Telemedicine).  Patients are able to view lab/test results, encounter notes, upcoming appointments, etc.  Non-urgent messages can be sent to your provider as well.   To learn more about what you can do with MyChart, go to [email or web address].    Your next appointment:   8 month(s)  The format for your next appointment:   In Person  Provider:   Rozann Lesches, MD   Other Instructions None      Thank you for choosing [facility] !

## 2020-08-19 NOTE — Progress Notes (Signed)
Cardiology Office Note  Date: 08/19/2020   ID: [place], Rothgeb 05/09/1956, MRN [id number]  PCP:  Asencion Noble, MD  Cardiologist:  Rozann Lesches, MD Electrophysiologist:  None   Chief Complaint  Patient presents with   Cardiac follow-up    History of Present Illness: Jonathan Walsh is a 64 y.o. male last seen in June by Mr. Leonides Sake NP.  He presents for a routine visit.  Reports no active angina or nitroglycerin use.  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  I reviewed his medications which are stable from a cardiac perspective and outlined below.  He did have lab work earlier in May with Dr. Willey Blade, LDL was 56 at that time.  We discussed walking for exercise.  Last ischemic assessment was in 2017, low risk Myoview at that time. We will plan to update a study next year.  Past Medical History:  Diagnosis Date   Coronary artery disease    Angioplasty of OM 2002   Diverticulosis    Essential hypertension    History of colonic polyps    Colonoscopy 2011   History of non-ST elevation myocardial infarction (NSTEMI)    2002   Hyperlipidemia    Nephrolithiasis    Type 2 diabetes mellitus ([place])     History reviewed. No pertinent surgical history.  Current Outpatient Medications  Medication Sig Dispense Refill   aspirin EC 81 MG tablet Take 81 mg by mouth daily.     glimepiride (AMARYL) 4 MG tablet Take 4 mg by mouth daily with breakfast.     JARDIANCE 25 MG TABS tablet Take 25 mg by mouth daily.     metFORMIN (GLUCOPHAGE) 500 MG tablet Take 500 mg by mouth. 4 tabs every morning     metoprolol succinate (TOPROL-XL) 25 MG 24 hr tablet Take 25 mg by mouth.       nitroGLYCERIN (NITROSTAT) 0.4 MG SL tablet Place 0.4 mg under the tongue every 5 (five) minutes as needed for chest pain.     ramipril (ALTACE) 2.5 MG tablet Take 2.5 mg by mouth daily.       rosuvastatin (CRESTOR) 20 MG tablet Take 20 mg by  mouth daily.     Semaglutide,0.25 or 0.5MG /DOS, (OZEMPIC, 0.25 OR 0.5 MG/DOSE,) 2 MG/1.5ML SOPN      No current facility-administered medications for this visit.   Allergies:  Patient has no known allergies.   ROS: No palpitations or syncope.  Physical Exam: VS:  BP 116/62    Pulse 90    Ht 5\' 8"  (1.727 m)    Wt 190 lb (86.2 kg)    SpO2 96%    BMI 28.89 kg/m , BMI Body mass index is 28.89 kg/m.  Wt Readings from Last 3 Encounters:  08/19/20 190 lb (86.2 kg)  03/03/20 187 lb 9.6 oz (85.1 kg)  06/21/19 188 lb (85.3 kg)    General: Patient appears comfortable at rest. HEENT: Conjunctiva and lids normal, wearing a mask. Neck: Supple, no elevated JVP or carotid bruits, no thyromegaly. Lungs: Clear to auscultation, nonlabored breathing at rest. Cardiac: Regular rate and rhythm, no S3 or significant systolic murmur, no pericardial rub. Extremities: No pitting edema.  ECG:  An ECG dated 03/03/2020 was personally reviewed today and demonstrated:  Sinus rhythm with nonspecific T wave changes.  Recent Labwork:  May 2021: BUN 15, creatinine 0.8, potassium 4.5, AST 18, ALT 23, hemoglobin 17.5, platelets 285, cholesterol  127, triglycerides 157, HDL 40, LDL 56, hemoglobin A1c 6.8%  Other Studies Reviewed Today:  Exercise Myoview 05/12/2016:  Blood pressure demonstrated a normal response to exercise.  There was no ST segment deviation noted during stress.  The study is normal.  This is a low risk study.  The left ventricular ejection fraction is hyperdynamic (>65%).  Some thinning of the inferior wall likely from motion no ischemia or infarction EF 72%  Assessment and Plan:  1.  CAD with history of angioplasty to the obtuse marginal in 2002.  He continues to do well without angina on medical therapy.  Last ischemic evaluation was in 2017, we will plan to update a Myoview after his visit next year.  Continue aspirin, Toprol-XL, Altase, Crestor, and as needed nitroglycerin.  2.   Mixed hyperlipidemia, tolerating Crestor.  Last LDL 56.  3.  Type 2 diabetes mellitus, hemoglobin A1c 6.8%.  He continues to follow with Dr. Willey Blade on Amaryl, Jardiance, and Glucophage.  Medication Adjustments/Labs and Tests Ordered: Current medicines are reviewed at length with the patient today.  Concerns regarding medicines are outlined above.   Tests Ordered: No orders of the defined types were placed in this encounter.   Medication Changes: No orders of the defined types were placed in this encounter.   Disposition:  Follow up 8 months in the [place] office.  Signed, Satira Sark, MD, [facility] 08/19/2020 1:43 PM    [place] [facility] [address] [place], [place] [address] Phone: [phone]; Fax: [phone]

## 2021-07-23 ENCOUNTER — Encounter: Payer: Self-pay | Admitting: Emergency Medicine

## 2021-07-23 ENCOUNTER — Other Ambulatory Visit: Payer: Self-pay

## 2021-07-23 ENCOUNTER — Ambulatory Visit
Admission: EM | Admit: 2021-07-23 | Discharge: 2021-07-23 | Disposition: A | Discharge status: Home / Self Care | Payer: 59

## 2021-07-23 DIAGNOSIS — R531 Weakness: Secondary | ICD-10-CM | POA: Diagnosis not present

## 2021-07-23 DIAGNOSIS — U071 COVID-19: Secondary | ICD-10-CM

## 2021-07-23 DIAGNOSIS — R000 Tachycardia, unspecified: Secondary | ICD-10-CM | POA: Diagnosis not present

## 2021-07-23 MED ORDER — PROMETHAZINE-DM 6.25-15 MG/5ML PO SYRP: 5.0000 mL | ORAL_SOLUTION | Freq: Four times a day (QID) | ORAL | 0 refills | Status: DC | PRN

## 2021-07-23 MED ORDER — ALBUTEROL SULFATE HFA 108 (90 BASE) MCG/ACT IN AERS: 1.0000 | INHALATION_SPRAY | Freq: Four times a day (QID) | RESPIRATORY_TRACT | 0 refills | Status: DC | PRN

## 2021-07-23 NOTE — ED Provider Notes (Signed)
[facility]    CSN: [id number] Arrival date & time: 07/23/21  1320      History   Chief Complaint Chief Complaint  Patient presents with   Cough   Sore Throat    HPI Jonathan Walsh is a 65 y.o. male.   Presenting today with 2-day history of productive cough, sore throat, fatigue, weakness, congestion.  States he tested positive on home test for COVID.  Denies fever, body aches, chest pain, shortness of breath, abdominal pain, nausea vomiting or diarrhea.  Taking over-the-counter cold and flu medications with mild temporary relief of symptoms.  Does have a history of diabetes and heart disease so wanted to come get checked out and see if he needed an antiviral medication.   Past Medical History:  Diagnosis Date   Coronary artery disease    Angioplasty of OM 2002   Diverticulosis    Essential hypertension    History of colonic polyps    Colonoscopy 2011   History of non-ST elevation myocardial infarction (NSTEMI)    2002   Hyperlipidemia    Nephrolithiasis    Type 2 diabetes mellitus [facility])     Patient Active Problem List   Diagnosis Date Noted   Coronary artery disease involving native coronary artery of native heart without angina pectoris 05/05/2016   DM 02/11/2009   HYPERLIPIDEMIA 02/11/2009   HTN (hypertension) 02/11/2009   CAD 02/11/2009    History reviewed. No pertinent surgical history.     Home Medications    Prior to Admission medications   Medication Sig Start Date End Date Taking? Authorizing Provider  albuterol (VENTOLIN HFA) 108 (90 Base) MCG/ACT inhaler Inhale 1-2 puffs into the lungs every 6 (six) hours as needed for wheezing or shortness of breath. 07/23/21  Yes Volney American, PA-C  aspirin EC 81 MG tablet Take 81 mg by mouth daily.   Yes [provider]  glimepiride (AMARYL) 4 MG tablet Take 4 mg by mouth daily with breakfast.   Yes [provider]  JARDIANCE 25 MG TABS tablet Take 25 mg by mouth daily.  01/29/20  Yes [provider]  metFORMIN (GLUCOPHAGE) 500 MG tablet Take 500 mg by mouth. 4 tabs every morning   Yes [provider]  metoprolol succinate (TOPROL-XL) 25 MG 24 hr tablet Take 25 mg by mouth.     Yes [provider]  pioglitazone (ACTOS) 15 MG tablet Take 15 mg by mouth daily. 06/30/21  Yes [provider]  promethazine-dextromethorphan (PROMETHAZINE-DM) 6.25-15 MG/5ML syrup Take 5 mLs by mouth 4 (four) times daily as needed for cough. 07/23/21  Yes Volney American, PA-C  ramipril (ALTACE) 2.5 MG tablet Take 2.5 mg by mouth daily.     Yes [provider]  rosuvastatin (CRESTOR) 20 MG tablet Take 20 mg by mouth daily.   Yes [provider]  Semaglutide,0.25 or 0.5MG /DOS, (OZEMPIC, 0.25 OR 0.5 MG/DOSE,) 2 MG/1.5ML SOPN  02/26/19  Yes [provider]  nitroGLYCERIN (NITROSTAT) 0.4 MG SL tablet Place 0.4 mg under the tongue every 5 (five) minutes as needed for chest pain.    [provider]    Family History Family History  Problem Relation Age of Onset   CAD Father        Status post CABG   Stroke Father    Diabetes Mellitus II Father    Arthritis Mother    Asthma Sister    CAD Paternal Grandfather     Social History Social  History   Tobacco Use   Smoking status: Never   Smokeless tobacco: Never  Vaping Use   Vaping Use: Never used  Substance Use Topics   Alcohol use: No   Drug use: No     Allergies   Patient has no known allergies.   Review of Systems Review of Systems Per HPI  Physical Exam Triage Vital Signs ED Triage Vitals  Enc Vitals Group     BP 07/23/21 1509 133/85     Pulse Rate 07/23/21 1509 (!) 118     Resp 07/23/21 1509 19     Temp 07/23/21 1509 98.6 F (37 C)     Temp Source 07/23/21 1509 Oral     SpO2 07/23/21 1509 93 %     Weight --      Height --      Head Circumference --      Peak Flow --      Pain Score 07/23/21 1512 6     Pain Loc --      Pain Edu?  --      Excl. in [place]? --    No data found.  Updated Vital Signs BP 133/85 (BP Location: Right Arm)   Pulse (!) 118   Temp 98.6 F (37 C) (Oral)   Resp 19   SpO2 93%   Visual Acuity Right Eye Distance:   Left Eye Distance:   Bilateral Distance:    Right Eye Near:   Left Eye Near:    Bilateral Near:     Physical Exam Vitals and nursing note reviewed.  Constitutional:      Appearance: Normal appearance.  HENT:     Head: Atraumatic.     Right Ear: Tympanic membrane normal.     Left Ear: Tympanic membrane normal.     Nose: Rhinorrhea present.     Mouth/Throat:     Mouth: Mucous membranes are moist.     Pharynx: Posterior oropharyngeal erythema present. No oropharyngeal exudate.  Eyes:     Extraocular Movements: Extraocular movements intact.     Conjunctiva/sclera: Conjunctivae normal.  Cardiovascular:     Rate and Rhythm: Normal rate and regular rhythm.  Pulmonary:     Effort: Pulmonary effort is normal.     Breath sounds: Normal breath sounds. No wheezing or rales.  Abdominal:     General: Bowel sounds are normal. There is no distension.     Palpations: Abdomen is soft.     Tenderness: There is no abdominal tenderness. There is no guarding.  Musculoskeletal:        General: Normal range of motion.     Cervical back: Normal range of motion and neck supple.  Skin:    General: Skin is warm and dry.  Neurological:     General: No focal deficit present.     Mental Status: He is oriented to person, place, and time.  Psychiatric:        Mood and Affect: Mood normal.        Thought Content: Thought content normal.        Judgment: Judgment normal.     UC Treatments / Results  Labs (all labs ordered are listed, but only abnormal results are displayed) Labs Reviewed - No data to display  EKG   Radiology No results found.  Procedures Procedures (including critical care time)  Medications Ordered in UC Medications - No data to display  Initial Impression /  Assessment and Plan / UC Course  I  have reviewed the triage vital signs and the nursing notes.  Pertinent labs & imaging results that were available during my care of the patient were reviewed by me and considered in my medical decision making (see chart for details).     Tachycardic in triage but otherwise vital signs and exam reassuring.  COVID positive per home test 2 days ago.  Discussed antiviral therapy versus conservative management with symptomatic medications.  He is agreeable to conservative management with albuterol inhaler, Phenergan DM, Mucinex, over-the-counter pain relievers as needed.  Discussed strict return precautions for worsening symptoms.  Final Clinical Impressions(s) / UC Diagnoses   Final diagnoses:  COVID-19  Tachycardia  Weakness   Discharge Instructions   None    ED Prescriptions     Medication Sig Dispense Auth. Provider   albuterol (VENTOLIN HFA) 108 (90 Base) MCG/ACT inhaler Inhale 1-2 puffs into the lungs every 6 (six) hours as needed for wheezing or shortness of breath. [place], [place]   promethazine-dextromethorphan (PROMETHAZINE-DM) 6.25-15 MG/5ML syrup Take 5 mLs by mouth 4 (four) times daily as needed for cough. 100 mL Volney American, [place]      PDMP not reviewed this encounter.   Volney American, [place] 07/23/21 1610

## 2021-07-23 NOTE — ED Triage Notes (Signed)
Patient c/o productive cough and sore throat x 2 days.   Patient denies fever. Patient denies N/V/D.   Patient has a POSITIVE COVID test on Wednesday and today per patient statement.   History of DM.

## 2021-07-27 ENCOUNTER — Ambulatory Visit: Payer: 59 | Admitting: Cardiology

## 2021-08-23 ENCOUNTER — Encounter: Payer: Self-pay | Admitting: *Deleted

## 2021-08-23 ENCOUNTER — Other Ambulatory Visit: Payer: Self-pay

## 2021-08-23 ENCOUNTER — Encounter: Payer: Self-pay | Admitting: Cardiology

## 2021-08-23 ENCOUNTER — Ambulatory Visit (INDEPENDENT_AMBULATORY_CARE_PROVIDER_SITE_OTHER): Payer: 59 | Admitting: Cardiology

## 2021-08-23 VITALS — BP 118/78 | HR 92 | Ht 68.0 in | Wt 183.0 lb

## 2021-08-23 DIAGNOSIS — I25119 Atherosclerotic heart disease of native coronary artery with unspecified angina pectoris: Secondary | ICD-10-CM | POA: Diagnosis not present

## 2021-08-23 DIAGNOSIS — E782 Mixed hyperlipidemia: Secondary | ICD-10-CM

## 2021-08-23 NOTE — Patient Instructions (Signed)
Medication Instructions:  Your physician recommends that you continue on your current medications as directed. Please refer to the Current Medication list given to you today.  Labwork: none  Testing/Procedures: Your physician has requested that you have a lexiscan myoview in 6 months just before next visit. For further information please visit [email or web address]. Please follow instruction sheet, as given.  Follow-Up: Your physician recommends that you schedule a follow-up appointment in: 6 months  Any Other Special Instructions Will Be Listed Below (If Applicable).  If you need a refill on your cardiac medications before your next appointment, please call your pharmacy.

## 2021-08-23 NOTE — Progress Notes (Signed)
Cardiology Office Note  Date: 08/23/2021   ID: Jonathan, Walsh 04-Apr-1956, MRN [id number]  PCP:  Jonathan Noble, MD  Cardiologist:  Jonathan Lesches, MD Electrophysiologist:  None   Chief Complaint  Patient presents with   Cardiac follow-up    History of Present Illness: Jonathan Walsh is a 65 y.o. male last seen in December 2021.  He is here for a routine follow-up visit.  Reports no active angina or nitroglycerin use.  He did have COVID-19 about a month ago, still not back to baseline with intermittent coughing.  He has not been walking as regularly.  I reviewed his cardiac medications which are stable and noted below.  Requesting interval lab work from Dr. Willey Walsh.  I personally reviewed his ECG today which shows sinus rhythm with rightward axis and nonspecific ST changes.  Last ischemic evaluation was in 2017 as noted below.  Past Medical History:  Diagnosis Date   Coronary artery disease    Angioplasty of OM 2002   Diverticulosis    Essential hypertension    History of colonic polyps    Colonoscopy 2011   History of non-ST elevation myocardial infarction (NSTEMI)    2002   Hyperlipidemia    Nephrolithiasis    Type 2 diabetes mellitus (HCC)     Past Surgical History:  Procedure Laterality Date   none      Current Outpatient Medications  Medication Sig Dispense Refill   albuterol (VENTOLIN HFA) 108 (90 Base) MCG/ACT inhaler Inhale 1-2 puffs into the lungs every 6 (six) hours as needed for wheezing or shortness of breath. 18 g 0   aspirin EC 81 MG tablet Take 81 mg by mouth daily.     glimepiride (AMARYL) 4 MG tablet Take 4 mg by mouth daily with breakfast.     JARDIANCE 25 MG TABS tablet Take 25 mg by mouth daily.     metFORMIN (GLUCOPHAGE) 500 MG tablet Take 500 mg by mouth. 4 tabs every morning     metoprolol succinate (TOPROL-XL) 25 MG 24 hr tablet Take 25 mg by mouth.       nitroGLYCERIN (NITROSTAT) 0.4 MG SL tablet Place 0.4 mg under the tongue every 5  (five) minutes as needed for chest pain.     OZEMPIC, 1 MG/DOSE, 4 MG/3ML SOPN Inject into the skin once a week.     pioglitazone (ACTOS) 15 MG tablet Take 15 mg by mouth daily.     promethazine-dextromethorphan (PROMETHAZINE-DM) 6.25-15 MG/5ML syrup Take 5 mLs by mouth 4 (four) times daily as needed for cough. 100 mL 0   ramipril (ALTACE) 2.5 MG tablet Take 2.5 mg by mouth daily.       rosuvastatin (CRESTOR) 20 MG tablet Take 20 mg by mouth daily.     No current facility-administered medications for this visit.   Allergies:  Patient has no known allergies.   ROS: No palpitations or syncope.  Physical Exam: VS:  BP 118/78   Pulse 92   Ht 5\' 8"  (1.727 m)   Wt 183 lb (83 kg)   SpO2 97%   BMI 27.83 kg/m , BMI Body mass index is 27.83 kg/m.  Wt Readings from Last 3 Encounters:  08/23/21 183 lb (83 kg)  08/19/20 190 lb (86.2 kg)  03/03/20 187 lb 9.6 oz (85.1 kg)    General: Patient appears comfortable at rest. HEENT: Conjunctiva and lids normal, wearing a mask. Neck: Supple, no elevated JVP or carotid bruits, no thyromegaly. Lungs: Clear  to auscultation, nonlabored breathing at rest. Cardiac: Regular rate and rhythm, no S3 or significant systolic murmur, no pericardial rub. Extremities: No pitting edema.  ECG:  An ECG dated 03/03/2020 was personally reviewed today and demonstrated:  Sinus rhythm with nonspecific T wave changes.  Recent Labwork:  May 2021: BUN 15, creatinine 0.8, potassium 4.5, AST 18, ALT 23, hemoglobin 17.5, platelets 285, cholesterol 127, triglycerides 157, HDL 40, LDL 56, hemoglobin A1c 6.8%  Other Studies Reviewed Today:  Exercise Myoview 05/12/2016: Blood pressure demonstrated a normal response to exercise. There was no ST segment deviation noted during stress. The study is normal. This is a low risk study. The left ventricular ejection fraction is hyperdynamic (>65%).   Some thinning of the inferior wall likely from motion no ischemia or  infarction EF 72%  Assessment and Plan:  1.  CAD with remote angioplasty to the obtuse marginal in 2002.  Plan to obtain a follow-up Lexiscan Myoview for ischemic surveillance and visit in 6 months.  Last ischemic evaluation was in 2017.  Continue aspirin, Toprol-XL, Altace, Crestor, Jardiance, and as needed nitroglycerin  2.  Mixed hyperlipidemia on Crestor.  Last LDL was 56.  Requesting interval lab work from Dr. Willey Walsh.  Medication Adjustments/Labs and Tests Ordered: Current medicines are reviewed at length with the patient today.  Concerns regarding medicines are outlined above.   Tests Ordered: Orders Placed This Encounter  Procedures   EKG 12-Lead     Medication Changes: No orders of the defined types were placed in this encounter.   Disposition:  Follow up  6 months.  Signed, Jonathan Sark, MD, [facility] 08/23/2021 11:04 AM    [facility] [place], [place], [place] [address] Phone: [phone]; Fax: [phone]

## 2021-12-14 ENCOUNTER — Other Ambulatory Visit (HOSPITAL_COMMUNITY): Payer: Self-pay | Admitting: Internal Medicine

## 2021-12-14 ENCOUNTER — Other Ambulatory Visit: Payer: Self-pay | Admitting: Internal Medicine

## 2021-12-14 ENCOUNTER — Telehealth: Payer: Self-pay

## 2021-12-14 ENCOUNTER — Ambulatory Visit (HOSPITAL_COMMUNITY)
Admission: RE | Admit: 2021-12-14 | Discharge: 2021-12-14 | Disposition: A | Discharge status: Home / Self Care | Payer: 59 | Source: Ambulatory Visit | Attending: Urology | Admitting: Urology

## 2021-12-14 ENCOUNTER — Other Ambulatory Visit: Payer: Self-pay

## 2021-12-14 ENCOUNTER — Ambulatory Visit (HOSPITAL_COMMUNITY)
Admission: RE | Admit: 2021-12-14 | Discharge: 2021-12-14 | Disposition: A | Discharge status: Home / Self Care | Payer: 59 | Source: Ambulatory Visit | Attending: Internal Medicine | Admitting: Internal Medicine

## 2021-12-14 ENCOUNTER — Ambulatory Visit: Payer: 59 | Admitting: Urology

## 2021-12-14 DIAGNOSIS — N200 Calculus of kidney: Secondary | ICD-10-CM

## 2021-12-14 DIAGNOSIS — Z87442 Personal history of urinary calculi: Secondary | ICD-10-CM

## 2021-12-14 DIAGNOSIS — N23 Unspecified renal colic: Secondary | ICD-10-CM

## 2021-12-14 NOTE — Telephone Encounter (Signed)
12/14/21 - Dr. Beverlee Nims office called central scheduling to schedule ct abdm pelvis w/ contrast - STAT// I placed the order and called tech -- Micah -- diagnosis was placed as Renal Colic for the order. Micah advised if it was for a kidney stone it needs to be w/o contrast.  ? ?Office made aware and stated pt has a kidney stone they just wanted to see severity . Per [employer] advised to call radiologist to ensure order was placed correctly - unsure if it should be w/ or w/o contrast.  ? ?Radiologist number [phone] given to Kathlee Nations at Dr. Beverlee Nims office .  ?

## 2021-12-15 ENCOUNTER — Ambulatory Visit (INDEPENDENT_AMBULATORY_CARE_PROVIDER_SITE_OTHER): Payer: 59 | Admitting: Urology

## 2021-12-15 ENCOUNTER — Other Ambulatory Visit: Payer: Self-pay

## 2021-12-15 ENCOUNTER — Encounter: Payer: Self-pay | Admitting: Urology

## 2021-12-15 VITALS — BP 115/78 | HR 92

## 2021-12-15 DIAGNOSIS — N200 Calculus of kidney: Secondary | ICD-10-CM

## 2021-12-15 MED ORDER — TAMSULOSIN HCL 0.4 MG PO CAPS: 0.4000 mg | ORAL_CAPSULE | Freq: Every day | ORAL | 0 refills | Status: DC

## 2021-12-15 MED ORDER — OXYCODONE-ACETAMINOPHEN 5-325 MG PO TABS: 1.0000 | ORAL_TABLET | ORAL | 0 refills | Status: DC | PRN

## 2021-12-15 MED ORDER — ONDANSETRON HCL 4 MG PO TABS: 4.0000 mg | ORAL_TABLET | Freq: Three times a day (TID) | ORAL | 0 refills | Status: DC | PRN

## 2021-12-15 NOTE — H&P (View-Only) (Signed)
? ?12/15/2021 ?11:44 AM  ? ?Jonathan Walsh ?May 13, 1956 ?[id number] ? ?Referring provider: Asencion Noble, MD ?[address] ?[place],  [place] [address] ? ?nephrolithiasis ? ? ?HPI: ?Jonathan Walsh is a 66yo here for evaluation of nephrolithiasis. He developed left flank pain 5 days ago which was sharp, intermittent, moderate and nonraditing. This is his second stone event. Last stone event was in 2013. He had associated nausea and vomiting which is improved today. No worsening LUTS. He had gross hematuria 6 weeks ago.  He saw Jonathan Walsh and underwent CT yesterday which showed a 75m left proximal ureteral calculus. KUB today confirms left proximal ureteral calculus. Pain is mild currently. Nausea/vomiting resolved. No fevers ? ? ?PMH: ?Past Medical History:  ?Diagnosis Date  ? Coronary artery disease   ? Angioplasty of OM 2002  ? Diverticulosis   ? Essential hypertension   ? History of colonic polyps   ? Colonoscopy 2011  ? History of non-ST elevation myocardial infarction (NSTEMI)   ? 2002  ? Hyperlipidemia   ? Nephrolithiasis   ? Type 2 diabetes mellitus ([place]   ? ? ?Surgical History: ?Past Surgical History:  ?Procedure Laterality Date  ? none    ? ? ?Home Medications:  ?Allergies as of 12/15/2021   ?No Known Allergies ?  ? ?  ?Medication List  ?  ? ?  ? Accurate as of December 15, 2021 11:44 AM. If you have any questions, ask your nurse or doctor.  ?  ?  ? ?  ? ?albuterol 108 (90 Base) MCG/ACT inhaler ?Commonly known as: VENTOLIN HFA ?Inhale 1-2 puffs into the lungs every 6 (six) hours as needed for wheezing or shortness of breath. ?  ?aspirin EC 81 MG tablet ?Take 81 mg by mouth daily. ?  ?glimepiride 4 MG tablet ?Commonly known as: AMARYL ?Take 4 mg by mouth daily with breakfast. ?  ?Jardiance 25 MG Tabs tablet ?Generic drug: empagliflozin ?Take 25 mg by mouth daily. ?  ?metFORMIN 500 MG tablet ?Commonly known as: GLUCOPHAGE ?Take 500 mg by mouth. 4 tabs every morning ?  ?metFORMIN 500 MG 24 hr tablet ?Commonly known  as: GLUCOPHAGE-XR ?Take 2,000 mg by mouth every morning. ?  ?metoprolol succinate 25 MG 24 hr tablet ?Commonly known as: TOPROL-XL ?Take 25 mg by mouth. ?  ?nitroGLYCERIN 0.4 MG SL tablet ?Commonly known as: NITROSTAT ?Place 0.4 mg under the tongue every 5 (five) minutes as needed for chest pain. ?  ?Ozempic (1 MG/DOSE) 4 MG/3ML Sopn ?Generic drug: Semaglutide (1 MG/DOSE) ?Inject into the skin once a week. ?  ?pioglitazone 15 MG tablet ?Commonly known as: ACTOS ?Take 15 mg by mouth daily. ?  ?promethazine-dextromethorphan 6.25-15 MG/5ML syrup ?Commonly known as: PROMETHAZINE-DM ?Take 5 mLs by mouth 4 (four) times daily as needed for cough. ?  ?ramipril 2.5 MG tablet ?Commonly known as: ALTACE ?Take 2.5 mg by mouth daily. ?  ?rosuvastatin 20 MG tablet ?Commonly known as: CRESTOR ?Take 20 mg by mouth daily. ?  ?tamsulosin 0.4 MG Caps capsule ?Commonly known as: FLOMAX ?Take 0.4 mg by mouth daily. ?  ? ?  ? ? ?Allergies: No Known Allergies ? ?Family History: ?Family History  ?Problem Relation Age of Onset  ? CAD Father   ?     Status post CABG  ? Stroke Father   ? Diabetes Mellitus II Father   ? Arthritis Mother   ? Asthma Sister   ? CAD Paternal Grandfather   ? ? ?Social History:  reports  that he has never smoked. He has never used smokeless tobacco. He reports that he does not drink alcohol and does not use drugs. ? ?ROS: ?All other review of systems were reviewed and are negative except what is noted above in HPI ? ?Physical Exam: ?BP 115/78   Pulse 92   ?Constitutional:  Alert and oriented, No acute distress. ?HEENT: [place] AT, moist mucus membranes.  Trachea midline, no masses. ?Cardiovascular: No clubbing, cyanosis, or edema. ?Respiratory: Normal respiratory effort, no increased work of breathing. ?GI: Abdomen is soft, nontender, nondistended, no abdominal masses ?GU: No CVA tenderness.  ?Lymph: No cervical or inguinal lymphadenopathy. ?Skin: No rashes, bruises or suspicious lesions. ?Neurologic: Grossly intact, no  focal deficits, moving all 4 extremities. ?Psychiatric: Normal mood and affect. ? ?Laboratory Data: ?No results found for: WBC, HGB, HCT, MCV, PLT ? ?No results found for: CREATININE ? ?No results found for: PSA ? ?No results found for: TESTOSTERONE ? ?No results found for: HGBA1C ? ?Urinalysis ?No results found for: COLORURINE, APPEARANCEUR, [place], [place], [place], [place], [place], KETONESUR, PROTEINUR, [place], NITRITE, LEUKOCYTESUR ? ?No results found for: LABMICR, [place], RBCUA, LABEPIT, MUCUS, BACTERIA ? ?Pertinent Imaging: ?Ct yesterday and KUB today: Images reviewed and discussed with the patient  ?No results found for this or any previous visit. ? ?No results found for this or any previous visit. ? ?No results found for this or any previous visit. ? ?No results found for this or any previous visit. ? ?No results found for this or any previous visit. ? ?No results found for this or any previous visit. ? ?No results found for this or any previous visit. ? ?No results found for this or any previous visit. ? ? ?Assessment & Plan:   ? ?1. Kidney stones ?-We discussed the management of kidney stones. These options include observation, ureteroscopy, shockwave lithotripsy (ESWL) and percutaneous nephrolithotomy (PCNL). We discussed which options are relevant to the patient's stone(s). We discussed the natural history of kidney stones as well as the complications of untreated stones and the impact on quality of life without treatment as well as with each of the above listed treatments. We also discussed the efficacy of each treatment in its ability to clear the stone burden. With any of these management options I discussed the signs and symptoms of infection and the need for emergent treatment should these be experienced. For each option we discussed the ability of each procedure to clear the patient of their stone burden.  ? ?For observation I described the risks which include but are not limited to  silent renal damage, life-threatening infection, need for emergent surgery, failure to pass stone and pain.  ? ?For ureteroscopy I described the risks which include bleeding, infection, damage to contiguous structures, positioning injury, ureteral stricture, ureteral avulsion, ureteral injury, need for prolonged ureteral stent, inability to perform ureteroscopy, need for an interval procedure, inability to clear stone burden, stent discomfort/pain, heart attack, stroke, pulmonary embolus and the inherent risks with general anesthesia.  ? ?For shockwave lithotripsy I described the risks which include arrhythmia, kidney contusion, kidney hemorrhage, need for transfusion, pain, inability to adequately break up stone, inability to pass stone fragments, Steinstrasse, infection associated with obstructing stones, need for alternate surgical procedure, need for repeat shockwave lithotripsy, MI, CVA, PE and the inherent risks with anesthesia/conscious sedation.  ? ?For PCNL I described the risks including positioning injury, pneumothorax, hydrothorax, need for chest tube, inability to clear stone burden, renal laceration, arterial venous fistula or malformation, need for  embolization of kidney, loss of kidney or renal function, need for repeat procedure, need for prolonged nephrostomy tube, ureteral avulsion, MI, CVA, PE and the inherent risks of general anesthesia.  ? ?- The patient would like to proceed with left ESWL ? ? ?No follow-ups on file. ? ?Nicolette Bang, MD ? ?[facility] [place] ?  ?

## 2021-12-15 NOTE — Progress Notes (Signed)
? ?12/15/2021 ?11:44 AM  ? ?Jonathan Walsh ?03-22-1956 ?[id number] ? ?Referring provider: Asencion Noble, MD ?[address] ?[place],  [place] [address] ? ?nephrolithiasis ? ? ?HPI: ?Jonathan Walsh is a 66yo here for evaluation of nephrolithiasis. He developed left flank pain 5 days ago which was sharp, intermittent, moderate and nonraditing. This is his second stone event. Last stone event was in 2013. He had associated nausea and vomiting which is improved today. No worsening LUTS. He had gross hematuria 6 weeks ago.  He saw Dr. Willey Blade and underwent CT yesterday which showed a 67m left proximal ureteral calculus. KUB today confirms left proximal ureteral calculus. Pain is mild currently. Nausea/vomiting resolved. No fevers ? ? ?PMH: ?Past Medical History:  ?Diagnosis Date  ? Coronary artery disease   ? Angioplasty of OM 2002  ? Diverticulosis   ? Essential hypertension   ? History of colonic polyps   ? Colonoscopy 2011  ? History of non-ST elevation myocardial infarction (NSTEMI)   ? 2002  ? Hyperlipidemia   ? Nephrolithiasis   ? Type 2 diabetes mellitus ([place]   ? ? ?Surgical History: ?Past Surgical History:  ?Procedure Laterality Date  ? none    ? ? ?Home Medications:  ?Allergies as of 12/15/2021   ?No Known Allergies ?  ? ?  ?Medication List  ?  ? ?  ? Accurate as of December 15, 2021 11:44 AM. If you have any questions, ask your nurse or doctor.  ?  ?  ? ?  ? ?albuterol 108 (90 Base) MCG/ACT inhaler ?Commonly known as: VENTOLIN HFA ?Inhale 1-2 puffs into the lungs every 6 (six) hours as needed for wheezing or shortness of breath. ?  ?aspirin EC 81 MG tablet ?Take 81 mg by mouth daily. ?  ?glimepiride 4 MG tablet ?Commonly known as: AMARYL ?Take 4 mg by mouth daily with breakfast. ?  ?Jardiance 25 MG Tabs tablet ?Generic drug: empagliflozin ?Take 25 mg by mouth daily. ?  ?metFORMIN 500 MG tablet ?Commonly known as: GLUCOPHAGE ?Take 500 mg by mouth. 4 tabs every morning ?  ?metFORMIN 500 MG 24 hr tablet ?Commonly known  as: GLUCOPHAGE-XR ?Take 2,000 mg by mouth every morning. ?  ?metoprolol succinate 25 MG 24 hr tablet ?Commonly known as: TOPROL-XL ?Take 25 mg by mouth. ?  ?nitroGLYCERIN 0.4 MG SL tablet ?Commonly known as: NITROSTAT ?Place 0.4 mg under the tongue every 5 (five) minutes as needed for chest pain. ?  ?Ozempic (1 MG/DOSE) 4 MG/3ML Sopn ?Generic drug: Semaglutide (1 MG/DOSE) ?Inject into the skin once a week. ?  ?pioglitazone 15 MG tablet ?Commonly known as: ACTOS ?Take 15 mg by mouth daily. ?  ?promethazine-dextromethorphan 6.25-15 MG/5ML syrup ?Commonly known as: PROMETHAZINE-DM ?Take 5 mLs by mouth 4 (four) times daily as needed for cough. ?  ?ramipril 2.5 MG tablet ?Commonly known as: ALTACE ?Take 2.5 mg by mouth daily. ?  ?rosuvastatin 20 MG tablet ?Commonly known as: CRESTOR ?Take 20 mg by mouth daily. ?  ?tamsulosin 0.4 MG Caps capsule ?Commonly known as: FLOMAX ?Take 0.4 mg by mouth daily. ?  ? ?  ? ? ?Allergies: No Known Allergies ? ?Family History: ?Family History  ?Problem Relation Age of Onset  ? CAD Father   ?     Status post CABG  ? Stroke Father   ? Diabetes Mellitus II Father   ? Arthritis Mother   ? Asthma Sister   ? CAD Paternal Grandfather   ? ? ?Social History:  reports  that he has never smoked. He has never used smokeless tobacco. He reports that he does not drink alcohol and does not use drugs. ? ?ROS: ?All other review of systems were reviewed and are negative except what is noted above in HPI ? ?Physical Exam: ?BP 115/78   Pulse 92   ?Constitutional:  Alert and oriented, No acute distress. ?HEENT: [place] AT, moist mucus membranes.  Trachea midline, no masses. ?Cardiovascular: No clubbing, cyanosis, or edema. ?Respiratory: Normal respiratory effort, no increased work of breathing. ?GI: Abdomen is soft, nontender, nondistended, no abdominal masses ?GU: No CVA tenderness.  ?Lymph: No cervical or inguinal lymphadenopathy. ?Skin: No rashes, bruises or suspicious lesions. ?Neurologic: Grossly intact, no  focal deficits, moving all 4 extremities. ?Psychiatric: Normal mood and affect. ? ?Laboratory Data: ?No results found for: WBC, HGB, HCT, MCV, PLT ? ?No results found for: CREATININE ? ?No results found for: PSA ? ?No results found for: TESTOSTERONE ? ?No results found for: HGBA1C ? ?Urinalysis ?No results found for: COLORURINE, APPEARANCEUR, [place], [place], [place], [place], [place], KETONESUR, PROTEINUR, [place], NITRITE, LEUKOCYTESUR ? ?No results found for: LABMICR, [place], RBCUA, LABEPIT, MUCUS, BACTERIA ? ?Pertinent Imaging: ?Ct yesterday and KUB today: Images reviewed and discussed with the patient  ?No results found for this or any previous visit. ? ?No results found for this or any previous visit. ? ?No results found for this or any previous visit. ? ?No results found for this or any previous visit. ? ?No results found for this or any previous visit. ? ?No results found for this or any previous visit. ? ?No results found for this or any previous visit. ? ?No results found for this or any previous visit. ? ? ?Assessment & Plan:   ? ?1. Kidney stones ?-We discussed the management of kidney stones. These options include observation, ureteroscopy, shockwave lithotripsy (ESWL) and percutaneous nephrolithotomy (PCNL). We discussed which options are relevant to the patient's stone(s). We discussed the natural history of kidney stones as well as the complications of untreated stones and the impact on quality of life without treatment as well as with each of the above listed treatments. We also discussed the efficacy of each treatment in its ability to clear the stone burden. With any of these management options I discussed the signs and symptoms of infection and the need for emergent treatment should these be experienced. For each option we discussed the ability of each procedure to clear the patient of their stone burden.  ? ?For observation I described the risks which include but are not limited to  silent renal damage, life-threatening infection, need for emergent surgery, failure to pass stone and pain.  ? ?For ureteroscopy I described the risks which include bleeding, infection, damage to contiguous structures, positioning injury, ureteral stricture, ureteral avulsion, ureteral injury, need for prolonged ureteral stent, inability to perform ureteroscopy, need for an interval procedure, inability to clear stone burden, stent discomfort/pain, heart attack, stroke, pulmonary embolus and the inherent risks with general anesthesia.  ? ?For shockwave lithotripsy I described the risks which include arrhythmia, kidney contusion, kidney hemorrhage, need for transfusion, pain, inability to adequately break up stone, inability to pass stone fragments, Steinstrasse, infection associated with obstructing stones, need for alternate surgical procedure, need for repeat shockwave lithotripsy, MI, CVA, PE and the inherent risks with anesthesia/conscious sedation.  ? ?For PCNL I described the risks including positioning injury, pneumothorax, hydrothorax, need for chest tube, inability to clear stone burden, renal laceration, arterial venous fistula or malformation, need for  embolization of kidney, loss of kidney or renal function, need for repeat procedure, need for prolonged nephrostomy tube, ureteral avulsion, MI, CVA, PE and the inherent risks of general anesthesia.  ? ?- The patient would like to proceed with left ESWL ? ? ?No follow-ups on file. ? ?Nicolette Bang, MD ? ?[facility] [place] ?  ?

## 2021-12-15 NOTE — Progress Notes (Signed)
I spoke with Jonathan Walsh. We have discussed possible surgery dates and 12/21/2021 was agreed upon by all parties. Patient given information about surgery date, what to expect pre-operatively and post operatively.  ?  ?We discussed that a pre-op nurse will be calling to set up the pre-op visit that will take place prior to surgery. Informed patient that our office will communicate any additional care to be provided after surgery.  ?  ?Patients questions or concerns were discussed during our call. Advised to call our office should there be any additional information, questions or concerns that arise. Patient verbalized understanding.   ?

## 2021-12-15 NOTE — Patient Instructions (Addendum)
Dear [name], ?  ?Thank you for choosing [facility] to assist in your urologic care for your upcoming surgery. The following information below includes specific dates and details related to surgery: ?  ?The Surgical Procedure you are scheduled to have performed is Extracorporeal shock wave lithotripsy ?  ?Surgery Date: 12/21/2021 ?  ?Physician performing the surgery: Dr. Nicolette Bang ?  ?Do not eat or drink after midnight the day before your surgery.  ?  ?You will need a driver the day of surgery and will not be able to operate heavy machinery for 24 hours after.  ? ?Your post op appointment is listed on your AVS sheet as well. Please have your xray completed prior to coming back to your post op appointment. Your xray can be completed at [facility].  ?  ?Your surgery will be performed at  ?[address] ?[place], [place] [address] ?  ?Enter at the [employer] and check in at the [place] desk.   ?  ?Pre-Admit Testing Info ?  ?Pre- Admit appointments are interview with an anesthesiologist or a pre-operative anesthesia nurse. These appointments are typically completed as an in person visit but can take place over the telephone.  You will be contacted to confirm the date and time window.  ?  ?If you have any questions or concerns, please don't hesitate to call the office at [phone] ?  ?Thank you,  ?  ?Gibson Ramp, RN ?Clinical Surgery Coordinator ?[facility]  ? ? ? ?Lithotripsy ?Lithotripsy is a treatment that can help break up kidney stones that are too large to pass on their own. This is a nonsurgical procedure that crushes a kidney stone with shock waves. These shock waves pass through your body and focus on the kidney stone. They cause the kidney stone to break up into smaller pieces while it is still in the urinary tract. The smaller pieces of stone can pass more easily out of your body in the urine. ?Tell a health care  provider about: ?Any allergies you have. ?All medicines you are taking, including vitamins, herbs, eye drops, creams, and over-the-counter medicines. ?Any problems you or family members have had with anesthetic medicines. ?Any blood disorders you have. ?Any surgeries you have had. ?Any medical conditions you have. ?Whether you are pregnant or may be pregnant. ?What are the risks? ?Generally, this is a safe procedure. However, problems may occur, including: ?Infection. ?Bleeding from the kidney. ?Bruising of the kidney or skin. ?Scarring of the kidney, which can lead to: ?Increased blood pressure. ?Poor kidney function. ?Return (recurrence) of kidney stones. ?Damage to other structures or organs, such as the liver, colon, spleen, or pancreas. ?Blockage (obstruction) of the tube that carries urine from the kidney to the bladder (ureter). ?Failure of the kidney stone to break into pieces (fragments). ?What happens before the procedure? ?Staying hydrated ?Follow instructions from your health care provider about hydration, which may include: ?Up to 2 hours before the procedure - you may continue to drink clear liquids, such as water, clear fruit juice, black coffee, and plain tea. ?Eating and drinking restrictions ?Follow instructions from your health care provider about eating and drinking, which may include: ?8 hours before the procedure - stop eating heavy meals or foods, such as meat, fried foods, or fatty foods. ?6 hours before the procedure - stop eating light meals or foods, such as toast or cereal. ?6 hours before the procedure -  stop drinking milk or drinks that contain milk. ?2 hours before the procedure - stop drinking clear liquids. ?Medicines ?Ask your health care provider about: ?Changing or stopping your regular medicines. This is especially important if you are taking diabetes medicines or blood thinners. ?Taking medicines such as aspirin and ibuprofen. These medicines can thin your blood. Do not take  these medicines unless your health care provider tells you to take them. ?Taking over-the-counter medicines, vitamins, herbs, and supplements. ?Tests ?You may have tests, such as: ?Blood tests. ?Urine tests. ?Imaging tests, such as a CT scan. ?General instructions ?Plan to have someone take you home from the hospital or clinic. ?If you will be going home right after the procedure, plan to have someone with you for 24 hours. ?Ask your health care provider what steps will be taken to help prevent infection. These may include washing skin with a germ-killing soap. ?What happens during the procedure? ? ?An IV will be inserted into one of your veins. ?You will be given one or more of the following: ?A medicine to help you relax (sedative). ?A medicine to make you fall asleep (general anesthetic). ?A water-filled cushion may be placed behind your kidney or on your abdomen. In some cases, you may be placed in a tub of lukewarm water. ?Your body will be positioned in a way that makes it easy to target the kidney stone. ?An X-ray or ultrasound exam will be done to locate your stone. ?Shock waves will be aimed at the stone. If you are awake, you may feel a tapping sensation as the shock waves pass through your body. ?A flexible tube with holes in it (stent) may be placed in the ureter. This will help keep urine flowing from the kidney if the fragments of the stone have been blocking the ureter. ?The procedure may vary among health care providers and hospitals. ?What happens after the procedure? ?You may have an X-ray to see whether the procedure was able to break up the kidney stone and how much of the stone has passed. If large stone fragments remain after treatment, you may need to have a second procedure at a later time. ?Your blood pressure, heart rate, breathing rate, and blood oxygen level will be monitored until you leave the hospital or clinic. ?You may be given antibiotics or pain medicine as needed. ?If a stent was  placed in your ureter during surgery, it may stay in place for a few weeks. ?You may need to strain your urine to collect pieces of the kidney stone for testing. ?You will need to drink plenty of water. ?If you were given a sedative during the procedure, it can affect you for several hours. Do not drive or operate machinery until your health care provider says that it is safe. ?Summary ?Lithotripsy is a treatment that can help break up kidney stones that are too large to pass on their own. ?Lithotripsy is a nonsurgical procedure that crushes a kidney stone with shock waves. ?Generally, this is a safe procedure. However, problems may occur, including damage to the kidney or other organs, infection, or obstruction of the tube that carries urine from the kidney to the bladder (ureter). ?You may have a stent placed in your ureter to help drain your urine. This stent may stay in place for a few weeks. ?After the procedure, you will need to drink plenty of water. You may be asked to strain your urine to collect pieces of the kidney stone for testing. ?This information  is not intended to replace advice given to you by your health care provider. Make sure you discuss any questions you have with your health care provider. ?Document Revised: 06/18/2019 Document Reviewed: 06/19/2019 ?Elsevier Patient Education ? [place]. ? ?

## 2021-12-17 ENCOUNTER — Encounter (HOSPITAL_COMMUNITY): Payer: Self-pay

## 2021-12-17 ENCOUNTER — Encounter (HOSPITAL_COMMUNITY)
Admission: RE | Admit: 2021-12-17 | Discharge: 2021-12-17 | Disposition: A | Discharge status: Home / Self Care | Payer: 59 | Source: Ambulatory Visit | Attending: Urology | Admitting: Urology

## 2021-12-21 ENCOUNTER — Encounter (HOSPITAL_COMMUNITY): Payer: Self-pay | Admitting: Urology

## 2021-12-21 ENCOUNTER — Ambulatory Visit (HOSPITAL_COMMUNITY): Payer: 59

## 2021-12-21 ENCOUNTER — Encounter (HOSPITAL_COMMUNITY)
Admission: RE | Disposition: A | Discharge status: Home / Self Care | Payer: Self-pay | Source: Home / Self Care | Attending: Urology

## 2021-12-21 ENCOUNTER — Ambulatory Visit (HOSPITAL_COMMUNITY)
Admission: RE | Admit: 2021-12-21 | Discharge: 2021-12-21 | Disposition: A | Discharge status: Home / Self Care | Payer: 59 | Attending: Urology | Admitting: Urology

## 2021-12-21 ENCOUNTER — Other Ambulatory Visit: Payer: Self-pay

## 2021-12-21 DIAGNOSIS — N201 Calculus of ureter: Secondary | ICD-10-CM | POA: Diagnosis not present

## 2021-12-21 DIAGNOSIS — I209 Angina pectoris, unspecified: Secondary | ICD-10-CM | POA: Insufficient documentation

## 2021-12-21 DIAGNOSIS — I252 Old myocardial infarction: Secondary | ICD-10-CM | POA: Insufficient documentation

## 2021-12-21 DIAGNOSIS — E119 Type 2 diabetes mellitus without complications: Secondary | ICD-10-CM | POA: Diagnosis not present

## 2021-12-21 DIAGNOSIS — I10 Essential (primary) hypertension: Secondary | ICD-10-CM | POA: Insufficient documentation

## 2021-12-21 HISTORY — PX: EXTRACORPOREAL SHOCK WAVE LITHOTRIPSY: SHX1557

## 2021-12-21 LAB — GLUCOSE, CAPILLARY: Glucose-Capillary: 137 mg/dL — ABNORMAL HIGH (ref 70–99)

## 2021-12-21 SURGERY — LITHOTRIPSY, ESWL
Anesthesia: LOCAL | Laterality: Left

## 2021-12-21 MED ORDER — ONDANSETRON HCL 4 MG PO TABS: 4.0000 mg | ORAL_TABLET | Freq: Three times a day (TID) | ORAL | 0 refills | Status: DC | PRN

## 2021-12-21 MED ORDER — DIAZEPAM 5 MG PO TABS
ORAL_TABLET | ORAL | Status: AC
  Administered 2021-12-21: 10 mg
  Filled 2021-12-21: qty 2

## 2021-12-21 MED ORDER — DIPHENHYDRAMINE HCL 25 MG PO CAPS
25.0000 mg | ORAL_CAPSULE | ORAL | Status: AC
  Administered 2021-12-21: 25 mg via ORAL
  Filled 2021-12-21: qty 1

## 2021-12-21 MED ORDER — SODIUM CHLORIDE 0.9 % IV SOLN
Freq: Once | INTRAVENOUS | Status: AC
  Administered 2021-12-21: 1000 mL via INTRAVENOUS

## 2021-12-21 MED ORDER — TAMSULOSIN HCL 0.4 MG PO CAPS
0.4000 mg | ORAL_CAPSULE | Freq: Every day | ORAL | 0 refills | Status: DC
Start: 2021-12-21 — End: 2022-02-23

## 2021-12-21 MED ORDER — OXYCODONE-ACETAMINOPHEN 5-325 MG PO TABS: 1.0000 | ORAL_TABLET | ORAL | 0 refills | Status: DC | PRN

## 2021-12-21 MED ORDER — DIAZEPAM 5 MG PO TABS: 10.0000 mg | ORAL_TABLET | Freq: Once | ORAL | Status: AC

## 2021-12-21 NOTE — Interval H&P Note (Signed)
History and Physical Interval Note: ? ?12/21/2021 ?7:54 AM ? ?Jonathan Walsh  has presented today for surgery, with the diagnosis of left ureteral calculus.  The various methods of treatment have been discussed with the patient and family. After consideration of risks, benefits and other options for treatment, the patient has consented to  Procedure(s): ?EXTRACORPOREAL SHOCK WAVE LITHOTRIPSY (ESWL) (Left) as a surgical intervention.  The patient's history has been reviewed, patient examined, no change in status, stable for surgery.  I have reviewed the patient's chart and labs.  Questions were answered to the patient's satisfaction.   ? ? ?Nicolette Bang ? ? ?

## 2021-12-22 ENCOUNTER — Encounter (HOSPITAL_COMMUNITY): Payer: Self-pay | Admitting: Urology

## 2021-12-22 ENCOUNTER — Telehealth: Payer: Self-pay

## 2021-12-22 NOTE — Telephone Encounter (Signed)
Patient's wife called asking when the patient is ok to start back his medications.  Per Dr. Alyson Ingles ok to start meds back today any blood thinners he will start back on Friday.  Wife aware and also asking if tylenol and motrin are appropriate for fever if necessary, informed her that is ok if needed. ?

## 2022-01-05 ENCOUNTER — Ambulatory Visit (INDEPENDENT_AMBULATORY_CARE_PROVIDER_SITE_OTHER): Payer: 59 | Admitting: Physician Assistant

## 2022-01-05 ENCOUNTER — Ambulatory Visit (HOSPITAL_COMMUNITY)
Admission: RE | Admit: 2022-01-05 | Discharge: 2022-01-05 | Disposition: A | Discharge status: Home / Self Care | Payer: 59 | Source: Ambulatory Visit | Attending: Urology | Admitting: Urology

## 2022-01-05 VITALS — BP 115/77 | HR 89 | Ht 68.0 in | Wt 182.0 lb

## 2022-01-05 DIAGNOSIS — N200 Calculus of kidney: Secondary | ICD-10-CM | POA: Diagnosis present

## 2022-01-05 DIAGNOSIS — Z87442 Personal history of urinary calculi: Secondary | ICD-10-CM

## 2022-01-05 LAB — URINALYSIS, ROUTINE W REFLEX MICROSCOPIC
Bilirubin, UA: NEGATIVE
Ketones, UA: NEGATIVE
Leukocytes,UA: NEGATIVE
Nitrite, UA: NEGATIVE
Protein,UA: NEGATIVE
RBC, UA: NEGATIVE
Specific Gravity, UA: 1.015 (ref 1.005–1.030)
Urobilinogen, Ur: 0.2 mg/dL (ref 0.2–1.0)
pH, UA: 5.5 (ref 5.0–7.5)

## 2022-01-05 NOTE — Progress Notes (Signed)
? ?Assessment: ?1. Kidney stones   ? ? ?Plan: ?Follow-up going forward discussed at length.  We will call the patient with results of stone analysis.  Stone prevention diet recommendations again given verbally and in written form.  He will follow-up in 6 weeks after renal ultrasound. ? ?Chief Complaint: ?No chief complaint on file. ? ? ?HPI: ?Jonathan Walsh is a 66 y.o. male who presents for continued evaluation of nephrolithiasis.  He is status post EWS L of the left proximal ureter stone on 12/21/2021.  No complaints today.  He has passed multiple stone fragments and brings them with him.  No dysuria, hematuria, fever, chills, nausea or vomiting.  He remains on Flomax and has approximately 1 to 2 weeks worth of left in his prescription.  KUB obtained prior to office visit= previous ureteral stone can no longer be identified.  No renal stones appreciated.  Images reviewed personally during patient's office visit. ? ?UA= clear today except for 3+ glucose ?IPSS score equals 4 ? ?Portions of the above documentation were copied from a prior visit for review purposes only. ? ?Allergies: ?No Known Allergies ? ?PMH: ?Past Medical History:  ?Diagnosis Date  ? Coronary artery disease   ? Angioplasty of OM 2002  ? Diverticulosis   ? Essential hypertension   ? History of colonic polyps   ? Colonoscopy 2011  ? History of non-ST elevation myocardial infarction (NSTEMI)   ? 2002  ? Hyperlipidemia   ? Nephrolithiasis   ? Type 2 diabetes mellitus ([place])   ? ? ?PSH: ?Past Surgical History:  ?Procedure Laterality Date  ? CARDIAC CATHETERIZATION    ? CORONARY ANGIOPLASTY    ? EXTRACORPOREAL SHOCK WAVE LITHOTRIPSY Left 12/21/2021  ? Procedure: EXTRACORPOREAL SHOCK WAVE LITHOTRIPSY (ESWL);  Surgeon: Cleon Gustin, MD;  Location: [facility];  Service: Urology;  Laterality: Left;  ? none    ? ? ?SH: ?Social History  ? ?Tobacco Use  ? Smoking status: Never  ? Smokeless tobacco: Never  ?Vaping Use  ? Vaping Use: Never used  ?Substance Use  Topics  ? Alcohol use: No  ? Drug use: No  ? ? ?ROS: ?Constitutional:  Negative for fever, chills, weight loss ?CV: Negative for chest pain ?Respiratory:  Negative for shortness of breath, wheezing, sleep apnea, frequent cough ?GI:  Negative for nausea, vomiting, bloody stool, GERD ? ?PE: ?BP 115/77   Pulse 89   Ht '5\' 8"'$  (1.727 m)   Wt 182 lb (82.6 kg)   BMI 27.67 kg/m?  ?GENERAL APPEARANCE:  Well appearing, well developed, well nourished, NAD ?HEENT:  Atraumatic, normocephalic ?NECK:  Supple. Trachea midline ?ABDOMEN:  Soft, non-tender, no masses ?MENTAL STATUS:  appropriate ?BACK:  Non-tender to palpation, No CVAT ?SKIN:  Warm, dry, and intact ? ? ?Results: ? ? ?Urinalysis ?No results found for: COLORURINE, APPEARANCEUR, [place], [place], [place], [place], [place], KETONESUR, PROTEINUR, [place], NITRITE, LEUKOCYTESUR ? ?No results found for: LABMICR, [place], RBCUA, LABEPIT, MUCUS, BACTERIA ? ?Pertinent Imaging: ? ?Results for orders placed during the hospital encounter of 12/21/21 ? ?DG Abd 1 View ? ?Narrative ?CLINICAL DATA:  Pre lithotripsy ? ?EXAM: ?ABDOMEN - 1 VIEW ? ?COMPARISON:  KUB 12/14/2021, CT abdomen/pelvis 12/14/2021 ? ?FINDINGS: ?A 1.0 cm rounded calcification is again seen projecting over the ?soft tissues just to the left of the L4-L5 disc space likely ?corresponding to the ureteral stone seen on the prior CT. This is ?not significantly changed compared to the prior radiographs. No ?other abnormal soft tissue calcifications are  seen. There is a ?nonobstructive bowel gas pattern. The bones are stable. ? ?IMPRESSION: ?Unchanged distal left ureteral stone. ? ? ?Electronically Signed ?By: Valetta Mole M.D. ?On: 12/21/2021 08:07 ? ? ?No results found for this or any previous visit (from the past 24 hour(s)).  ?

## 2022-01-09 LAB — CALCULI, WITH PHOTOGRAPH (CLINICAL LAB)
Calcium Oxalate Monohydrate: 100 %
Weight Calculi: 120 mg

## 2022-01-18 ENCOUNTER — Encounter (HOSPITAL_COMMUNITY): Payer: Self-pay | Admitting: Urology

## 2022-01-31 ENCOUNTER — Ambulatory Visit (HOSPITAL_COMMUNITY)
Admission: RE | Admit: 2022-01-31 | Discharge: 2022-01-31 | Disposition: A | Discharge status: Home / Self Care | Payer: 59 | Source: Ambulatory Visit | Attending: Physician Assistant | Admitting: Physician Assistant

## 2022-01-31 DIAGNOSIS — N200 Calculus of kidney: Secondary | ICD-10-CM | POA: Diagnosis not present

## 2022-02-01 ENCOUNTER — Ambulatory Visit (INDEPENDENT_AMBULATORY_CARE_PROVIDER_SITE_OTHER): Payer: 59 | Admitting: Physician Assistant

## 2022-02-01 VITALS — BP 117/78 | HR 91

## 2022-02-01 DIAGNOSIS — Z87442 Personal history of urinary calculi: Secondary | ICD-10-CM

## 2022-02-01 DIAGNOSIS — N200 Calculus of kidney: Secondary | ICD-10-CM

## 2022-02-01 NOTE — Progress Notes (Signed)
? ?Assessment: ?1. Kidney stones ?- Urinalysis, Routine w reflex microscopic ?- Ultrasound renal complete; Future ?  ? ?Plan: ?Stone prevention recommendations reiterated and printed for the patient at discharge.  Discussed sun drop intake as well.  Avoid dehydration.  The patient will follow-up in 6 months after renal ultrasound. ? ?Chief Complaint: ?No chief complaint on file. ? ? ?HPI: ?Jonathan Walsh is a 66 y.o. male who presents for continued evaluation of renal stones. He has been doing well since last visit with minimal LUTS. No gross hematuria. Renal US indicates no hydro. UA clear.  Stone analysis indicates calcium oxalate.  Patient admits to drinking some drops but otherwise follows stone prevention diet.  Physical exam scheduled with his primary care provider in a few months.  PSAs are checked yearly with PCP. ? ?IPSS=5 ? ? ?01/05/22 ?Jonathan Walsh is a 66 y.o. male who presents for continued evaluation of nephrolithiasis.  He is status post EWS L of the left proximal ureter stone on 12/21/2021.  No complaints today.  He has passed multiple stone fragments and brings them with him.  No dysuria, hematuria, fever, chills, nausea or vomiting.  He remains on Flomax and has approximately 1 to 2 weeks worth of left in his prescription.  KUB obtained prior to office visit= previous ureteral stone can no longer be identified.  No renal stones appreciated.  Images reviewed personally during patient's office visit. ?  ?UA= clear today except for 3+ glucose ?IPSS score equals 4 ? ?Portions of the above documentation were copied from a prior visit for review purposes only. ? ?Allergies: ?No Known Allergies ? ?PMH: ?Past Medical History:  ?Diagnosis Date  ? Coronary artery disease   ? Angioplasty of OM 2002  ? Diverticulosis   ? Essential hypertension   ? History of colonic polyps   ? Colonoscopy 2011  ? History of non-ST elevation myocardial infarction (NSTEMI)   ? 2002  ? Hyperlipidemia   ? Nephrolithiasis   ?  Type 2 diabetes mellitus ([place])   ? ? ?PSH: ?Past Surgical History:  ?Procedure Laterality Date  ? CARDIAC CATHETERIZATION    ? CORONARY ANGIOPLASTY    ? EXTRACORPOREAL SHOCK WAVE LITHOTRIPSY Left 12/21/2021  ? Procedure: EXTRACORPOREAL SHOCK WAVE LITHOTRIPSY (ESWL);  Surgeon: Cleon Gustin, MD;  Location: [facility];  Service: Urology;  Laterality: Left;  ? none    ? ? ?SH: ?Social History  ? ?Tobacco Use  ? Smoking status: Never  ? Smokeless tobacco: Never  ?Vaping Use  ? Vaping Use: Never used  ?Substance Use Topics  ? Alcohol use: No  ? Drug use: No  ? ? ?ROS: ?Constitutional:  Negative for fever, chills, weight loss ?CV: Negative for chest pain ?Respiratory:  Negative for shortness of breath, wheezing, sleep apnea, frequent cough ?GI:  Negative for nausea, vomiting, bloody stool, GERD ? ?PE: ?BP 117/78   Pulse 91  ?GENERAL APPEARANCE:  Well appearing, well developed, well nourished, NAD ?HEENT:  Atraumatic, normocephalic ?NECK:  Supple. Trachea midline ?ABDOMEN:  Soft, non-tender, no masses ?EXTREMITIES:  Moves all extremities well, without clubbing, cyanosis, or edema ?NEUROLOGIC:  Alert and oriented x 3, normal gait, CN II-XII grossly intact ?MENTAL STATUS:  appropriate ?BACK:  Non-tender to palpation, No CVAT ?SKIN:  Warm, dry, and intact ? ? ?Results: ?Laboratory Data: ?No results found for: WBC, HGB, HCT, MCV, PLT ? ?No results found for: CREATININE ? ?No results found for: PSA ? ?No results found for: TESTOSTERONE ? ?No results  found for: HGBA1C ? ?Urinalysis ?   ?Component Value Date/Time  ? APPEARANCEUR Clear 01/05/2022 1456  ? GLUCOSEU 3+ (A) 01/05/2022 1456  ? BILIRUBINUR Negative 01/05/2022 1456  ? PROTEINUR Negative 01/05/2022 1456  ? NITRITE Negative 01/05/2022 1456  ? LEUKOCYTESUR Negative 01/05/2022 1456  ? ? ?Lab Results  ?Component Value Date  ? LABMICR Comment 01/05/2022  ? ? ?Pertinent Imaging: ? ?Results for orders placed during the hospital encounter of 01/05/22 ? ?DG Abd 1  View ? ?Narrative ?CLINICAL DATA:  Follow-up lithotripsy 2 weeks ago. Patient had ?left-sided stone in the ureter at that time. ? ?EXAM: ?ABDOMEN - 1 VIEW ? ?COMPARISON:  December 21, 2021 ? ?FINDINGS: ?Both kidneys are significantly obscured by bowel contents. Within ?this limitation, no renal stones are identified. The left ureteral ?stone at the L5 level on the comparison study is not visualized ?today. Moderate fecal loading in the colon. ? ?IMPRESSION: ?No renal or ureteral stones identified. ? ?Moderate fecal loading in the colon. ? ? ?Electronically Signed ?By: Dorise Bullion III M.D. ?On: 01/07/2022 15:31 ? ?No results found for this or any previous visit. ? ?No results found for this or any previous visit. ? ?No results found for this or any previous visit. ? ?No results found for this or any previous visit. ? ?No results found for this or any previous visit. ? ?No results found for this or any previous visit. ? ?No results found for this or any previous visit. ? ?No results found for this or any previous visit (from the past 24 hour(s)).  ?

## 2022-02-02 LAB — URINALYSIS, ROUTINE W REFLEX MICROSCOPIC
Bilirubin, UA: NEGATIVE
Ketones, UA: NEGATIVE
Leukocytes,UA: NEGATIVE
Nitrite, UA: NEGATIVE
Protein,UA: NEGATIVE
RBC, UA: NEGATIVE
Specific Gravity, UA: 1.01 (ref 1.005–1.030)
Urobilinogen, Ur: 0.2 mg/dL (ref 0.2–1.0)
pH, UA: 5.5 (ref 5.0–7.5)

## 2022-02-15 ENCOUNTER — Other Ambulatory Visit: Payer: Self-pay

## 2022-02-15 DIAGNOSIS — I25119 Atherosclerotic heart disease of native coronary artery with unspecified angina pectoris: Secondary | ICD-10-CM

## 2022-02-16 ENCOUNTER — Ambulatory Visit: Payer: 59 | Admitting: Urology

## 2022-02-17 ENCOUNTER — Encounter: Payer: Self-pay | Admitting: Cardiology

## 2022-02-21 ENCOUNTER — Ambulatory Visit (HOSPITAL_COMMUNITY)
Admission: RE | Admit: 2022-02-21 | Discharge: 2022-02-21 | Disposition: A | Discharge status: Home / Self Care | Payer: 59 | Source: Ambulatory Visit | Attending: Cardiology | Admitting: Cardiology

## 2022-02-21 ENCOUNTER — Encounter (HOSPITAL_COMMUNITY)
Admission: RE | Admit: 2022-02-21 | Discharge: 2022-02-21 | Disposition: A | Discharge status: Home / Self Care | Payer: 59 | Source: Ambulatory Visit | Attending: Cardiology | Admitting: Cardiology

## 2022-02-21 DIAGNOSIS — I25119 Atherosclerotic heart disease of native coronary artery with unspecified angina pectoris: Secondary | ICD-10-CM | POA: Insufficient documentation

## 2022-02-21 LAB — NM MYOCAR MULTI W/SPECT W/WALL MOTION / EF
Base ST Depression (mm): 0 mm
LV dias vol: 78 mL (ref 62–150)
LV sys vol: 27 mL
Nuc Stress EF: 66 %
Peak HR: 108 {beats}/min
RATE: 0.3
Rest HR: 79 {beats}/min
Rest Nuclear Isotope Dose: 11 mCi
SDS: 2
SRS: 1
SSS: 3
ST Depression (mm): 0 mm
Stress Nuclear Isotope Dose: 30 mCi
TID: 1.11

## 2022-02-21 MED ORDER — TECHNETIUM TC 99M TETROFOSMIN IV KIT
10.0000 | PACK | Freq: Once | INTRAVENOUS | Status: AC | PRN
  Administered 2022-02-21: 11 via INTRAVENOUS

## 2022-02-21 MED ORDER — SODIUM CHLORIDE FLUSH 0.9 % IV SOLN
INTRAVENOUS | Status: AC
  Filled 2022-02-21: qty 10

## 2022-02-21 MED ORDER — REGADENOSON 0.4 MG/5ML IV SOLN
INTRAVENOUS | Status: AC
  Filled 2022-02-21: qty 5

## 2022-02-21 MED ORDER — SODIUM CHLORIDE FLUSH 0.9 % IV SOLN
INTRAVENOUS | Status: AC
  Administered 2022-02-21: 10 mL via INTRAVENOUS
  Filled 2022-02-21: qty 10

## 2022-02-21 MED ORDER — REGADENOSON 0.4 MG/5ML IV SOLN
INTRAVENOUS | Status: AC
  Administered 2022-02-21: 0.4 mg via INTRAVENOUS
  Filled 2022-02-21: qty 5

## 2022-02-21 MED ORDER — TECHNETIUM TC 99M TETROFOSMIN IV KIT
30.0000 | PACK | Freq: Once | INTRAVENOUS | Status: AC | PRN
  Administered 2022-02-21: 30 via INTRAVENOUS

## 2022-02-22 NOTE — Progress Notes (Unsigned)
Cardiology Office Note  Date: 02/23/2022   ID: Beldon, Nowling 1956-05-14, MRN [id number]  PCP:  Asencion Noble, MD  Cardiologist:  Rozann Lesches, MD Electrophysiologist:  None   Chief Complaint  Patient presents with   Cardiac follow-up    History of Present Illness: Jonathan Walsh is a 66 y.o. male last seen in December 2022.  He is here for a routine visit.  Reports doing well overall, no active angina symptoms or nitroglycerin use.  No major change in health although did have interval kidney stone.  He continues to see Dr. Willey Blade on a regular basis with next visit pending in July.  Recent follow-up Lexiscan Myoview was low risk with no significant ischemia and normal LVEF.  Overall reassuring on medical therapy and has done very well since prior angioplasty of the obtuse marginal in 2002.   Past Medical History:  Diagnosis Date   Coronary artery disease    Angioplasty of OM 2002   Diverticulosis    Essential hypertension    History of colonic polyps    Colonoscopy 2011   History of non-ST elevation myocardial infarction (NSTEMI)    2002   Hyperlipidemia    Nephrolithiasis    Type 2 diabetes mellitus ([place])     Past Surgical History:  Procedure Laterality Date   CARDIAC CATHETERIZATION     CORONARY ANGIOPLASTY     EXTRACORPOREAL SHOCK WAVE LITHOTRIPSY Left 12/21/2021   Procedure: EXTRACORPOREAL SHOCK WAVE LITHOTRIPSY (ESWL);  Surgeon: Cleon Gustin, MD;  Location: [facility];  Service: Urology;  Laterality: Left;   none      Current Outpatient Medications  Medication Sig Dispense Refill   aspirin EC 81 MG tablet Take 81 mg by mouth daily.     glimepiride (AMARYL) 4 MG tablet Take 4 mg by mouth daily with breakfast.     JARDIANCE 25 MG TABS tablet Take 25 mg by mouth daily.     metFORMIN (GLUCOPHAGE-XR) 500 MG 24 hr tablet Take 2,000 mg by mouth every morning.     metoprolol succinate (TOPROL-XL) 25 MG 24 hr tablet Take 25 mg by mouth.       nitroGLYCERIN  (NITROSTAT) 0.4 MG SL tablet Place 0.4 mg under the tongue every 5 (five) minutes as needed for chest pain.     OZEMPIC, 1 MG/DOSE, 4 MG/3ML SOPN Inject into the skin once a week.     pioglitazone (ACTOS) 15 MG tablet Take 15 mg by mouth daily.     ramipril (ALTACE) 2.5 MG tablet Take 2.5 mg by mouth daily.       rosuvastatin (CRESTOR) 20 MG tablet Take 20 mg by mouth daily.     No current facility-administered medications for this visit.   Allergies:  Patient has no known allergies.   ROS: No palpitations or syncope.  Physical Exam: VS:  BP 116/80   Pulse 85   Ht '5\' 8"'$  (1.727 m)   Wt 186 lb 9.6 oz (84.6 kg)   SpO2 96%   BMI 28.37 kg/m , BMI Body mass index is 28.37 kg/m.  Wt Readings from Last 3 Encounters:  02/23/22 186 lb 9.6 oz (84.6 kg)  01/05/22 182 lb (82.6 kg)  12/21/21 182 lb 15.7 oz (83 kg)    General: Patient appears comfortable at rest. HEENT: Conjunctiva and lids normal. Neck: Supple, no elevated JVP or carotid bruits, no thyromegaly. Lungs: Clear to auscultation, nonlabored breathing at rest. Cardiac: Regular rate and rhythm, no S3 or  significant systolic murmur, no pericardial rub. Extremities: No pitting edema.  ECG:  An ECG dated 08/23/2021 was personally reviewed today and demonstrated:  Sinus rhythm with rightward axis and nonspecific ST changes.  Recent Labwork:  No interval lab work for review today.  Other Studies Reviewed Today:  Lexiscan Myoview 02/21/2022:   The study is normal. The study is low risk.   No ST deviation was noted.   LV perfusion is normal. There is no evidence of ischemia. There is no evidence of infarction. Remote angioplasty to obtuse marginal branch in 2002   Left ventricular function is normal. End diastolic cavity size is normal. End systolic cavity size is normal.  Assessment and Plan:  1.  CAD status post angioplasty to the obtuse marginal in 2002.  He is doing very well with recent follow-up Lexiscan Myoview showing no  active ischemia and normal LVEF.  Continue observation on medical therapy including aspirin, Jardiance, Altace, Toprol-XL, and Crestor.  He has as needed nitroglycerin available.  2.  Mixed hyperlipidemia on Crestor.  Continue to follow with Dr. Willey Blade.  His LDL has been at goal over time.  Medication Adjustments/Labs and Tests Ordered: Current medicines are reviewed at length with the patient today.  Concerns regarding medicines are outlined above.   Tests Ordered: No orders of the defined types were placed in this encounter.   Medication Changes: No orders of the defined types were placed in this encounter.   Disposition:  Follow up  1 year, sooner if needed.  Signed, Satira Sark, MD, [facility] 02/23/2022 9:53 AM    [facility] [address], [place], [place] [address] Phone: [phone]; Fax: [phone]

## 2022-02-23 ENCOUNTER — Ambulatory Visit (INDEPENDENT_AMBULATORY_CARE_PROVIDER_SITE_OTHER): Payer: 59 | Admitting: Cardiology

## 2022-02-23 ENCOUNTER — Encounter: Payer: Self-pay | Admitting: Cardiology

## 2022-02-23 VITALS — BP 116/80 | HR 85 | Ht 68.0 in | Wt 186.6 lb

## 2022-02-23 DIAGNOSIS — I25119 Atherosclerotic heart disease of native coronary artery with unspecified angina pectoris: Secondary | ICD-10-CM | POA: Diagnosis not present

## 2022-02-23 DIAGNOSIS — E782 Mixed hyperlipidemia: Secondary | ICD-10-CM

## 2022-02-23 NOTE — Patient Instructions (Addendum)

## 2022-07-25 ENCOUNTER — Ambulatory Visit (HOSPITAL_COMMUNITY)
Admission: RE | Admit: 2022-07-25 | Discharge: 2022-07-25 | Disposition: A | Discharge status: Home / Self Care | Payer: 59 | Source: Ambulatory Visit | Attending: Physician Assistant | Admitting: Physician Assistant

## 2022-07-25 DIAGNOSIS — N200 Calculus of kidney: Secondary | ICD-10-CM | POA: Insufficient documentation

## 2022-08-01 ENCOUNTER — Ambulatory Visit: Payer: 59 | Admitting: Urology

## 2022-08-05 ENCOUNTER — Ambulatory Visit: Payer: 59 | Admitting: Urology

## 2022-08-15 ENCOUNTER — Ambulatory Visit (INDEPENDENT_AMBULATORY_CARE_PROVIDER_SITE_OTHER): Payer: 59 | Admitting: Urology

## 2022-08-15 VITALS — BP 111/73 | HR 80

## 2022-08-15 DIAGNOSIS — Z87442 Personal history of urinary calculi: Secondary | ICD-10-CM | POA: Diagnosis not present

## 2022-08-15 DIAGNOSIS — N200 Calculus of kidney: Secondary | ICD-10-CM

## 2022-08-15 LAB — URINALYSIS, ROUTINE W REFLEX MICROSCOPIC
Bilirubin, UA: NEGATIVE
Ketones, UA: NEGATIVE
Leukocytes,UA: NEGATIVE
Nitrite, UA: NEGATIVE
Protein,UA: NEGATIVE
RBC, UA: NEGATIVE
Specific Gravity, UA: <1.006 (ref 1.005–1.030)
Urobilinogen, Ur: 0.2 mg/dL (ref 0.2–1.0)
pH, UA: 5 (ref 5.0–7.5)

## 2022-08-15 NOTE — Progress Notes (Signed)
08/15/2022 11:27 AM   Jonathan Walsh 1956-07-11 [id number]  Referring provider: Asencion Noble, MD [address] [place],  [place] [address]  Followup nephrolithiasis   HPI: Mr Jonathan Walsh is a 66yo here for followup for nephrolithiasis. No stone events since last visit. He denies any flank pain. No significant LUTS. Renal US 07/25/2022 shows no calculi   PMH: Past Medical History:  Diagnosis Date   Coronary artery disease    Angioplasty of OM 2002   Diverticulosis    Essential hypertension    History of colonic polyps    Colonoscopy 2011   History of non-ST elevation myocardial infarction (NSTEMI)    2002   Hyperlipidemia    Nephrolithiasis    Type 2 diabetes mellitus ([place])     Surgical History: Past Surgical History:  Procedure Laterality Date   CARDIAC CATHETERIZATION     CORONARY ANGIOPLASTY     EXTRACORPOREAL SHOCK WAVE LITHOTRIPSY Left 12/21/2021   Procedure: EXTRACORPOREAL SHOCK WAVE LITHOTRIPSY (ESWL);  Surgeon: Cleon Gustin, MD;  Location: [facility];  Service: Urology;  Laterality: Left;   none      Home Medications:  Allergies as of 08/15/2022   No Known Allergies      Medication List        Accurate as of August 15, 2022 11:27 AM. If you have any questions, ask your nurse or doctor.          aspirin EC 81 MG tablet Take 81 mg by mouth daily.   glimepiride 4 MG tablet Commonly known as: AMARYL Take 4 mg by mouth daily with breakfast.   Jardiance 25 MG Tabs tablet Generic drug: empagliflozin Take 25 mg by mouth daily.   metFORMIN 500 MG 24 hr tablet Commonly known as: GLUCOPHAGE-XR Take 2,000 mg by mouth every morning.   metoprolol succinate 25 MG 24 hr tablet Commonly known as: TOPROL-XL Take 25 mg by mouth.   nitroGLYCERIN 0.4 MG SL tablet Commonly known as: NITROSTAT Place 0.4 mg under the tongue every 5 (five) minutes as needed for chest pain.   Ozempic (1 MG/DOSE) 4 MG/3ML Sopn Generic drug: Semaglutide (1  MG/DOSE) Inject into the skin once a week.   pioglitazone 15 MG tablet Commonly known as: ACTOS Take 15 mg by mouth daily.   ramipril 2.5 MG tablet Commonly known as: ALTACE Take 2.5 mg by mouth daily.   rosuvastatin 20 MG tablet Commonly known as: CRESTOR Take 20 mg by mouth daily.        Allergies: No Known Allergies  Family History: Family History  Problem Relation Age of Onset   CAD Father        Status post CABG   Stroke Father    Diabetes Mellitus II Father    Arthritis Mother    Asthma Sister    CAD Paternal Grandfather     Social History:  reports that he has never smoked. He has never used smokeless tobacco. He reports that he does not drink alcohol and does not use drugs.  ROS: All other review of systems were reviewed and are negative except what is noted above in HPI  Physical Exam: BP 111/73   Pulse 80   Constitutional:  Alert and oriented, No acute distress. HEENT: [place] AT, moist mucus membranes.  Trachea midline, no masses. Cardiovascular: No clubbing, cyanosis, or edema. Respiratory: Normal respiratory effort, no increased work of breathing. GI: Abdomen is soft, nontender, nondistended, no abdominal masses GU: No CVA tenderness.  Lymph: No  cervical or inguinal lymphadenopathy. Skin: No rashes, bruises or suspicious lesions. Neurologic: Grossly intact, no focal deficits, moving all 4 extremities. Psychiatric: Normal mood and affect.  Laboratory Data: No results found for: "WBC", "HGB", "HCT", "MCV", "PLT"  No results found for: "CREATININE"  No results found for: "PSA"  No results found for: "TESTOSTERONE"  No results found for: "HGBA1C"  Urinalysis    Component Value Date/Time   APPEARANCEUR Clear 02/01/2022 1153   GLUCOSEU 3+ (A) 02/01/2022 1153   BILIRUBINUR Negative 02/01/2022 1153   PROTEINUR Negative 02/01/2022 1153   NITRITE Negative 02/01/2022 1153   LEUKOCYTESUR Negative 02/01/2022 1153    Lab Results  Component Value  Date   LABMICR Comment 02/01/2022    Pertinent Imaging: Renal US 07/25/2022: Images reviewed and discussed with the patient  Results for orders placed during the hospital encounter of 01/05/22  DG Abd 1 View  Narrative CLINICAL DATA:  Follow-up lithotripsy 2 weeks ago. Patient had left-sided stone in the ureter at that time.  EXAM: ABDOMEN - 1 VIEW  COMPARISON:  December 21, 2021  FINDINGS: Both kidneys are significantly obscured by bowel contents. Within this limitation, no renal stones are identified. The left ureteral stone at the L5 level on the comparison study is not visualized today. Moderate fecal loading in the colon.  IMPRESSION: No renal or ureteral stones identified.  Moderate fecal loading in the colon.   Electronically Signed By: Dorise Bullion III M.D. On: 01/07/2022 15:31  No results found for this or any previous visit.  No results found for this or any previous visit.  No results found for this or any previous visit.  Results for orders placed during the hospital encounter of 07/25/22  Ultrasound renal complete  Narrative CLINICAL DATA:  nephrolithiasis  EXAM: RENAL / URINARY TRACT ULTRASOUND COMPLETE  COMPARISON:  Jan 31, 2022 December 14, 2021  FINDINGS: Right Kidney:  Renal measurements: 12.0 x 6.4 x 6.7 cm = volume: 268 mL. Echogenicity within normal limits. No mass or hydronephrosis visualized.  Left Kidney:  Renal measurements: 11.7 by 6.8 x 7.0 cm = volume: 292 mL. Echogenicity within normal limits. No mass or hydronephrosis visualized.  Bladder:  Appears normal for degree of bladder distention.  Other:  Mildly increased hepatic echogenicity.  IMPRESSION: 1. No hydronephrosis. 2. Mildly increased hepatic echogenicity as can be seen in hepatic steatosis.   Electronically Signed By: Valentino Saxon M.D. On: 07/27/2022 16:46  No valid procedures specified. No results found for this or any previous visit.  No  results found for this or any previous visit.   Assessment & Plan:    1. Kidney stones .Dietary handout given. Followup PRN - Urinalysis, Routine w reflex microscopic   No follow-ups on file.  Nicolette Bang, MD  [facility] [place]

## 2022-08-15 NOTE — Patient Instructions (Signed)

## 2022-08-23 ENCOUNTER — Encounter: Payer: Self-pay | Admitting: Urology

## 2023-06-05 ENCOUNTER — Encounter: Payer: Self-pay | Admitting: Internal Medicine

## 2023-07-20 ENCOUNTER — Encounter: Payer: Self-pay | Admitting: Cardiology

## 2023-07-20 ENCOUNTER — Ambulatory Visit: Payer: PRIVATE HEALTH INSURANCE | Attending: Cardiology | Admitting: Cardiology

## 2023-07-20 VITALS — BP 104/70 | HR 88 | Ht 68.0 in | Wt 187.0 lb

## 2023-07-20 DIAGNOSIS — E782 Mixed hyperlipidemia: Secondary | ICD-10-CM | POA: Diagnosis not present

## 2023-07-20 DIAGNOSIS — I10 Essential (primary) hypertension: Secondary | ICD-10-CM | POA: Diagnosis not present

## 2023-07-20 DIAGNOSIS — I25119 Atherosclerotic heart disease of native coronary artery with unspecified angina pectoris: Secondary | ICD-10-CM

## 2023-07-20 NOTE — Patient Instructions (Addendum)
Medication Instructions:  Your physician recommends that you continue on your current medications as directed. Please refer to the Current Medication list given to you today.   Labwork: None today  Testing/Procedures: None today  Follow-Up: 1 year  Any Other Special Instructions Will Be Listed Below (If Applicable).  If you need a refill on your cardiac medications before your next appointment, please call your pharmacy.  

## 2023-07-20 NOTE — Progress Notes (Signed)
    Cardiology Office Note  Date: 07/20/2023   ID: Zackarie, Massari 10/26/1955, MRN [id number]  History of Present Illness: Jonathan Walsh is a 67 y.o. male last seen in June 2023.  He is here for a follow-up visit.  States that he has been doing fairly well overall.  He walks 2 miles 5 to 6 days a week, reports good stamina and no exertional chest tightness.  He does have some nocturnal symptoms that sound more like reflux/indigestion.  I reviewed his medications.  Current cardiac regimen includes aspirin, Jardiance, Toprol-XL, Altace, and Crestor.  He reports no intolerances.  I reviewed his lab work from September, LDL 70 at that time.  He continues to follow with Dr. Ouida Sills.  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  Conduction abnormalities are new in comparison to prior tracing.  No sudden dizziness or syncope.  Heart rate in the 80s today on Toprol-XL.  Physical Exam: VS:  BP 104/70 (BP Location: Left Arm, Patient Position: Sitting, Cuff Size: Normal)   Pulse 88   Ht 5\' 8"  (1.727 m)   Wt 187 lb (84.8 kg)   SpO2 98%   BMI 28.43 kg/m , BMI Body mass index is 28.43 kg/m.  Wt Readings from Last 3 Encounters:  07/20/23 187 lb (84.8 kg)  02/23/22 186 lb 9.6 oz (84.6 kg)  01/05/22 182 lb (82.6 kg)    General: Patient appears comfortable at rest. HEENT: Conjunctiva and lids normal. Neck: Supple, no elevated JVP or carotid bruits. Lungs: Clear to auscultation, nonlabored breathing at rest. Cardiac: Regular rate and rhythm, no S3 or significant systolic murmur. Extremities: No pitting edema.  ECG:  An ECG dated 08/23/2021 was personally reviewed today and demonstrated:  Sinus rhythm with rightward axis and nonspecific ST changes.  Labwork:  September 2024: Hemoglobin 17.8, platelets 257, BUN 15, creatinine 0.81, potassium 5, AST 18, ALT 21, cholesterol 141, triglycerides 129, HDL 48, LDL 70, hemoglobin A1c 7.3%  Other  Studies Reviewed Today:  No interval cardiac testing for review today.  Assessment and Plan:  1.  CAD status post angioplasty of obtuse marginal in 2002.  Lexiscan Myoview in June 2023 was low risk with LVEF 66%.  He does not report any definite angina and has had no nitroglycerin use.  Walks regularly without limiting symptoms.  ECG reviewed with asymptomatic conduction abnormalities which we will follow.  Continue aspirin, Jardiance, Toprol-XL, and Crestor.  2.  Primary hypertension.  Blood pressure is well-controlled today.  No changes were made.  3.  Mixed hyperlipidemia.  LDL 70 in September of this year.  Continue Crestor.  Disposition:  Follow up  1 year.  Signed, Jonelle Sidle, M.D., F.A.C.C. [facility]

## 2024-02-27 IMAGING — DX DG ABDOMEN 1V
3 series · 3 of 3 positions shown · non-contrast
Comparison: December 21, 2021

CLINICAL DATA: Follow-up lithotripsy 2 weeks ago. Patient had
left-sided stone in the ureter at that time.

EXAM:
ABDOMEN - 1 VIEW

[abdomen kub (1 of 3)]
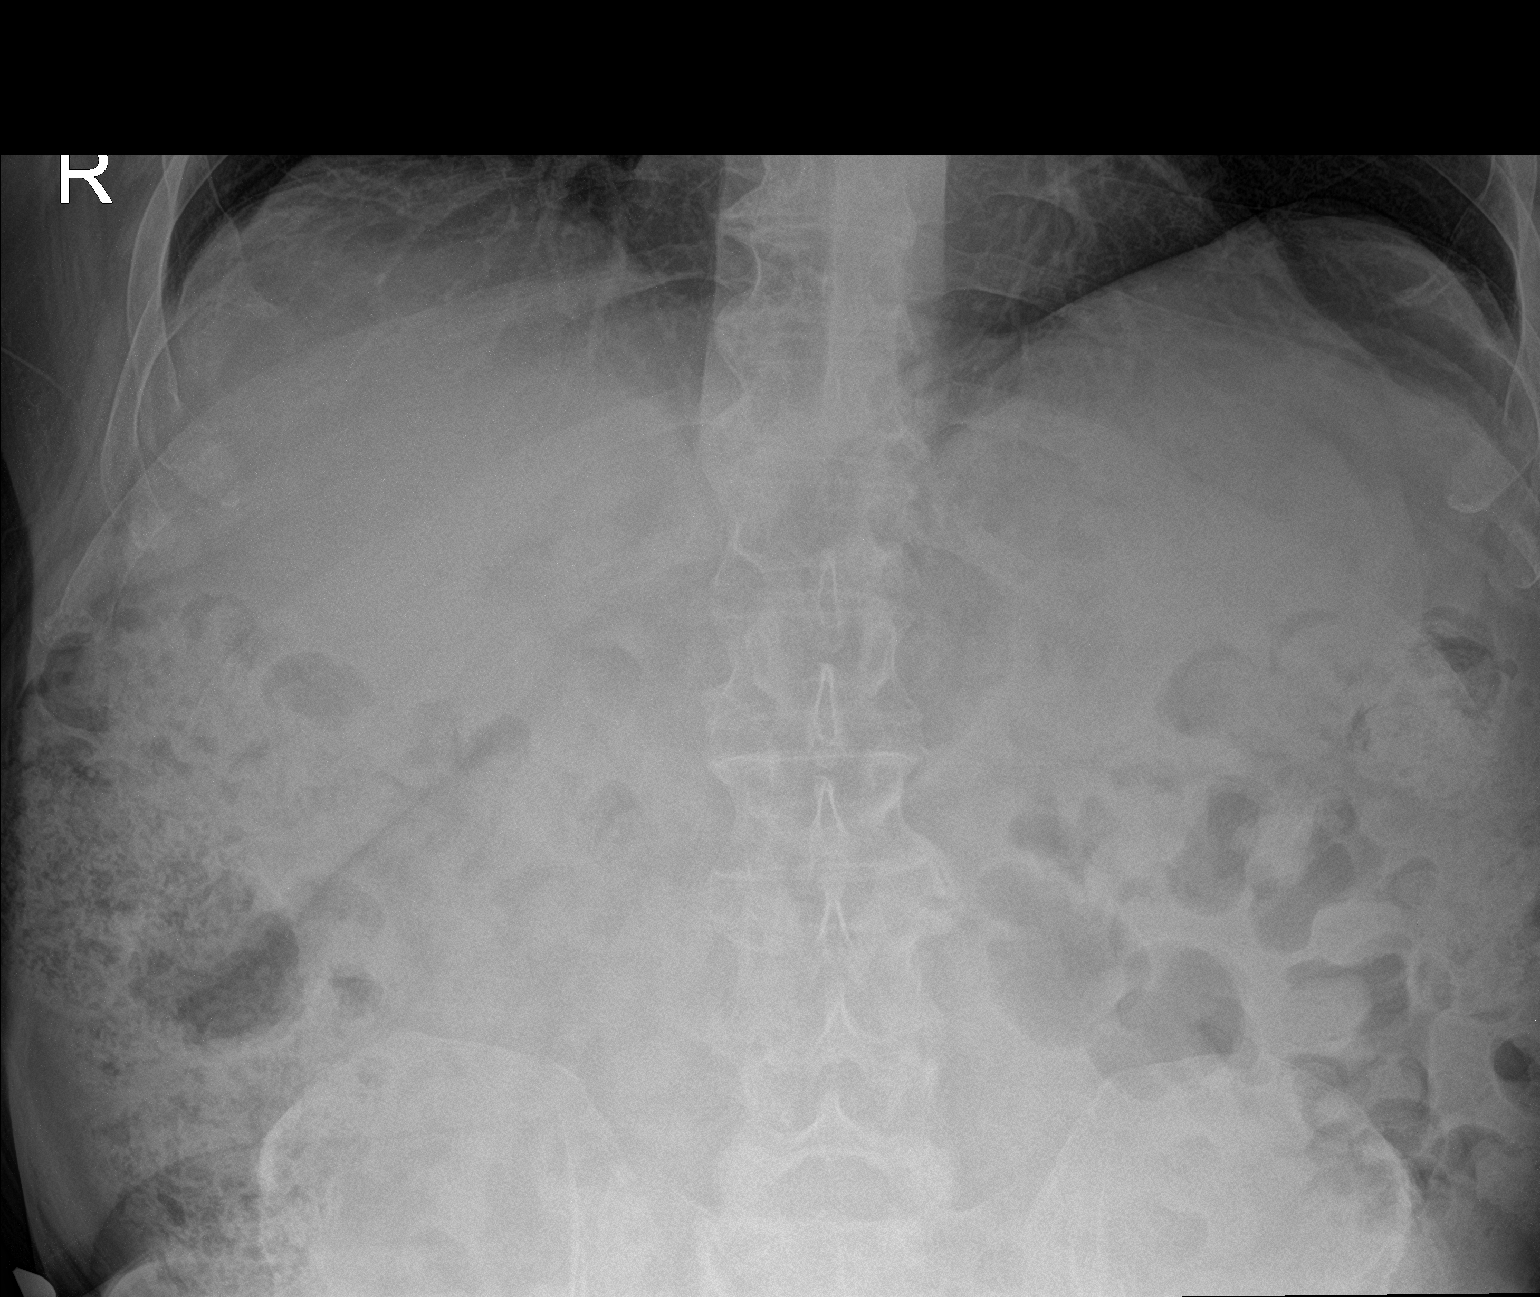

[abdomen kub (2 of 3)]
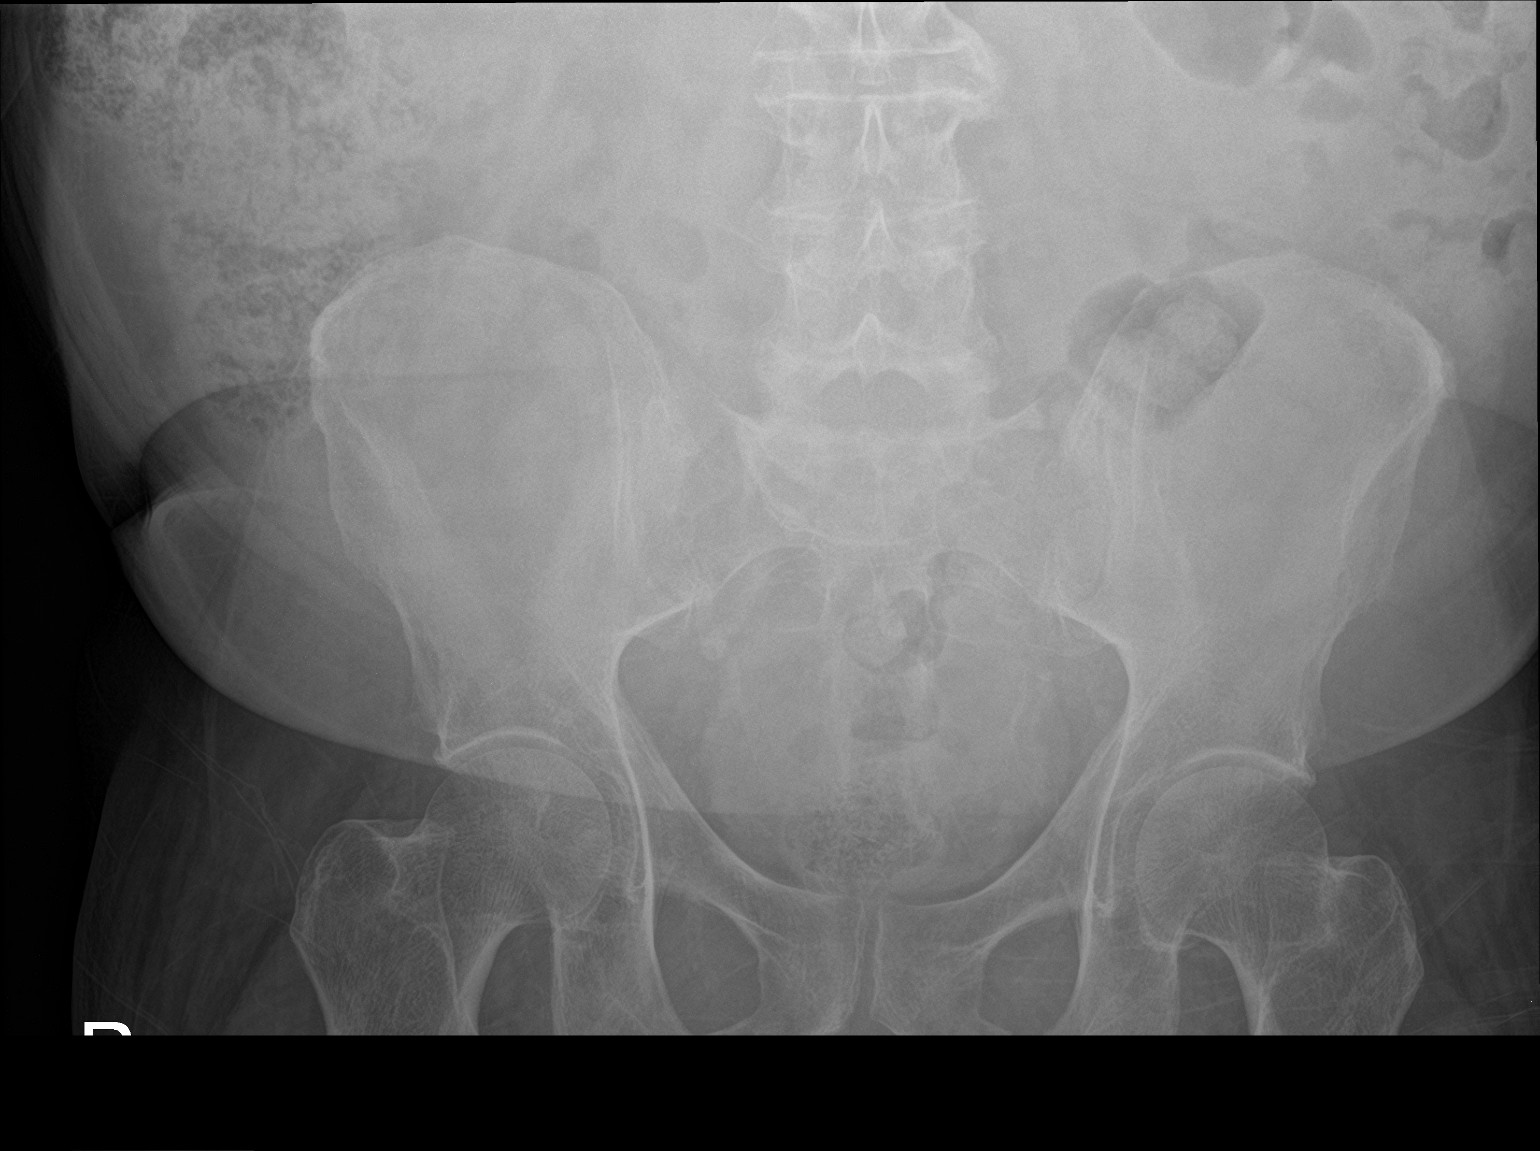

[abdomen kub (3 of 3)]
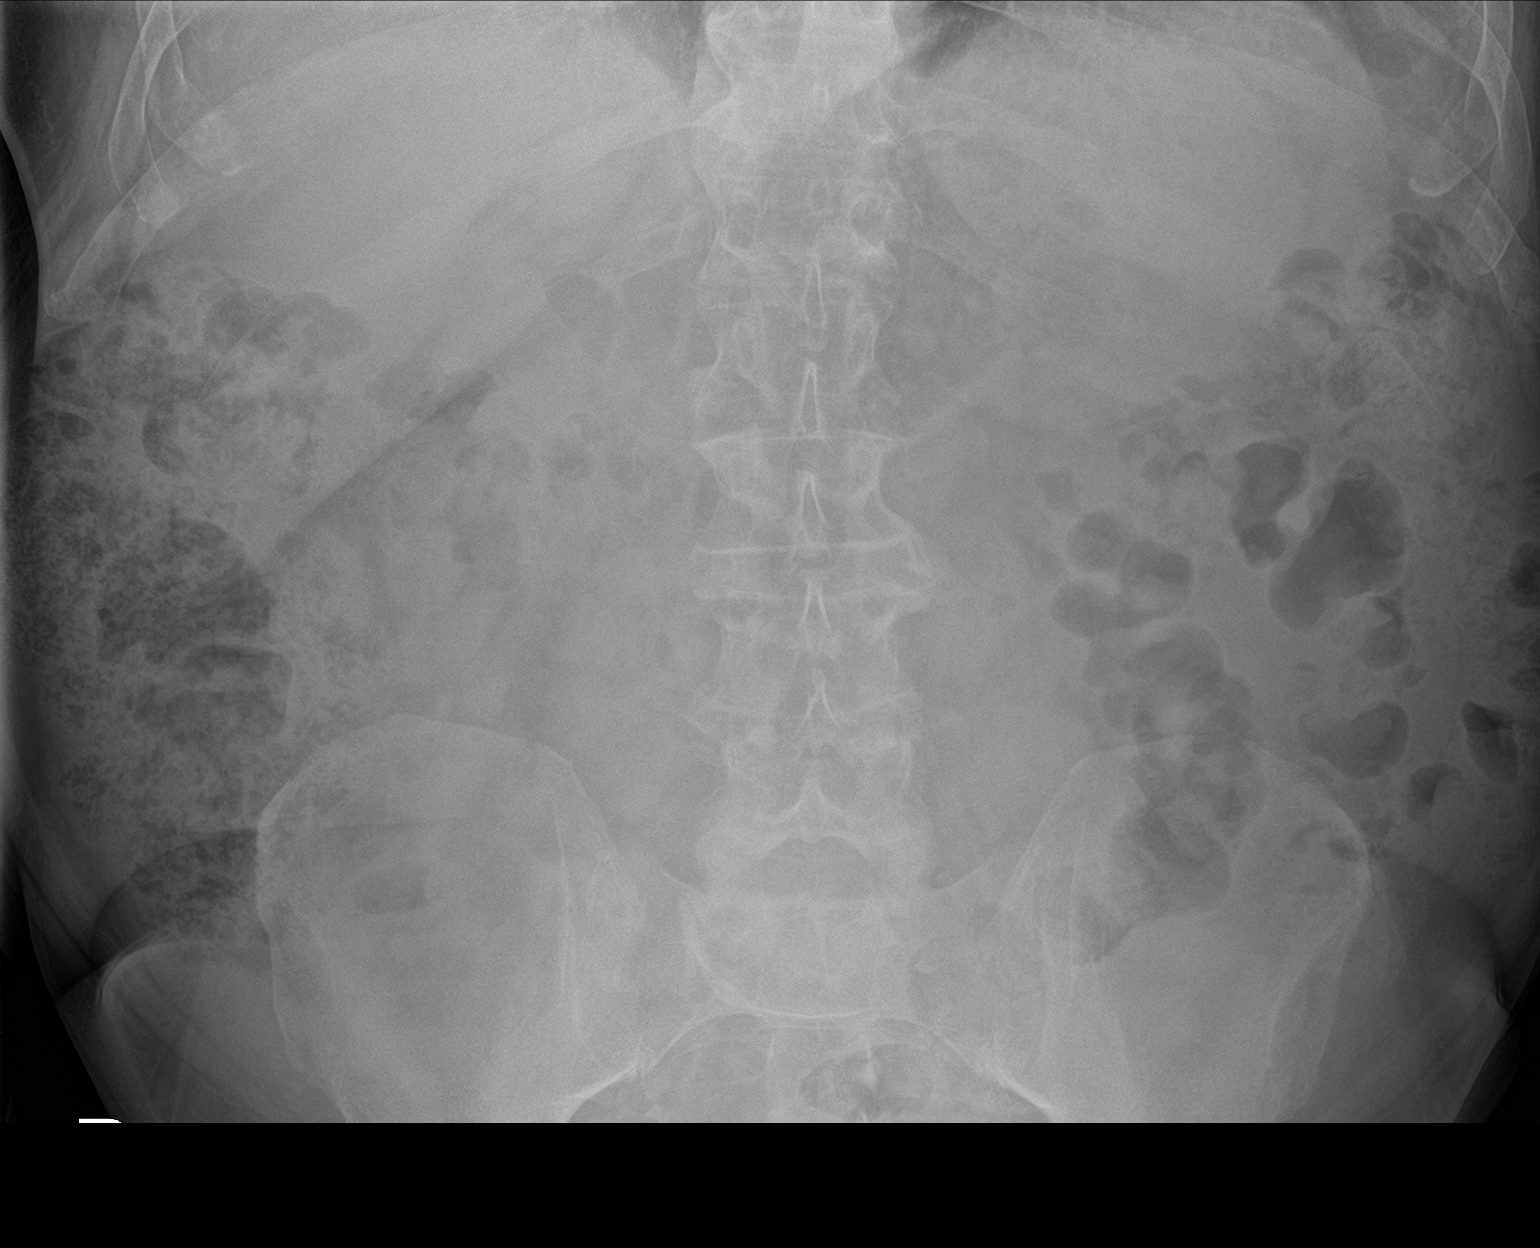

[3 of 3 positions shown; findings below may reference images not displayed]

FINDINGS: Both kidneys are significantly obscured by bowel contents. Within
this limitation, no renal stones are identified. The left ureteral
stone at the L5 level on the comparison study is not visualized
today. Moderate fecal loading in the colon.
IMPRESSION: No renal or ureteral stones identified.

Moderate fecal loading in the colon.

## 2024-07-12 ENCOUNTER — Ambulatory Visit: Attending: Cardiology | Admitting: Cardiology

## 2024-07-12 ENCOUNTER — Encounter: Payer: Self-pay | Admitting: Cardiology

## 2024-07-12 VITALS — BP 130/80 | HR 90 | Ht 68.0 in | Wt 194.8 lb

## 2024-07-12 DIAGNOSIS — I10 Essential (primary) hypertension: Secondary | ICD-10-CM | POA: Insufficient documentation

## 2024-07-12 DIAGNOSIS — I25119 Atherosclerotic heart disease of native coronary artery with unspecified angina pectoris: Secondary | ICD-10-CM | POA: Insufficient documentation

## 2024-07-12 DIAGNOSIS — E782 Mixed hyperlipidemia: Secondary | ICD-10-CM | POA: Insufficient documentation

## 2024-07-12 MED ORDER — ROSUVASTATIN CALCIUM 40 MG PO TABS: 40.0000 mg | ORAL_TABLET | Freq: Every day | ORAL | 3 refills | Status: AC

## 2024-07-12 NOTE — Patient Instructions (Signed)
 Medication Instructions:  INCREASE Crestor to 40 mg daily  Labwork: None today  Testing/Procedures: None today  Follow-Up: 1 year  Any Other Special Instructions Will Be Listed Below (If Applicable).  If you need a refill on your cardiac medications before your next appointment, please call your pharmacy.

## 2024-07-12 NOTE — Progress Notes (Signed)
    Cardiology Office Note  Date: 07/12/2024   ID: Jonathan Walsh, Jonathan Walsh 06-14-1956, MRN [id number]  History of Present Illness: Jonathan Walsh is a 68 y.o. male last seen in October 2024.  He is here for a follow-up visit.  He does not report any angina or interval nitroglycerin use.  Still walks 2 miles for exercise most days of the week.  No increasing dyspnea on exertion or change in stamina, no palpitations or syncope.  I went over his medications and reviewed his recent lab work from Dr. Sheryle.  We did discuss increasing his Crestor to 40 mg daily with recent LDL 81.  I reviewed his ECG today which shows sinus rhythm with right bundle branch block and left posterior fascicular block, stable compared to last tracing.  Physical Exam: VS:  BP 130/80   Pulse 90   Ht 5' 8 (1.727 m)   Wt 194 lb 12.8 oz (88.4 kg)   SpO2 96%   BMI 29.62 kg/m , BMI Body mass index is 29.62 kg/m.  Wt Readings from Last 3 Encounters:  07/12/24 194 lb 12.8 oz (88.4 kg)  07/20/23 187 lb (84.8 kg)  02/23/22 186 lb 9.6 oz (84.6 kg)    General: Patient appears comfortable at rest. HEENT: Conjunctiva and lids normal. Neck: Supple, no elevated JVP or carotid bruits. Lungs: Clear to auscultation, nonlabored breathing at rest. Cardiac: Regular rate and rhythm, no S3 or significant systolic murmur. Extremities: No pitting edema.  ECG:  An ECG dated 07/20/2023 was personally reviewed today and demonstrated:  Sinus rhythm with right bundle branch block and left posterior fascicular block.  Labwork:  October 2025: Hemoglobin 17.9, platelets 259, BUN 16, creatinine 0.9, GFR 94, potassium 4.8, AST 25, ALT 30, cholesterol 158, triglycerides 162, HDL 49, LDL 81, hemoglobin A1c 7.3%  Other Studies Reviewed Today:  No interval cardiac testing for review today.  Assessment and Plan:  1.  CAD status post angioplasty of obtuse marginal in 2002.  Lexiscan  Myoview  in June 2023 was low risk with LVEF 66%.  He  remains clinically stable with no active angina or interval nitroglycerin use.  Continues with regular walking plan.  I reviewed his ECG which is stable.  Continue aspirin 81 mg daily, statin, and as needed nitroglycerin.   2.  Primary hypertension.  No change to current regimen, continue Altace 2.5 mg daily and Toprol-XL 25 mg daily.   3.  Mixed hyperlipidemia.  Recent LDL 81.  Increase Crestor to 40 mg daily.  4.  Type 2 diabetes mellitus, followed by Dr. Sheryle.  Recent hemoglobin A1c 7.3%.  He is on Amaryl 4 mg daily, Jardiance 25 mg daily, Glucophage XR 2000 mg daily, and Ozempic 4 mg weekly.  Disposition:  Follow up 1 year.  Signed, Jayson JUDITHANN Sierras, M.D., F.A.C.C. [facility]
